# Patient Record
Sex: Male | Born: 1975 | Hispanic: No | Marital: Married | Smoking: Former smoker
Health system: Southern US, Community
[De-identification: ages and names within clinical notes are randomized; demographics above are authoritative.]

## PROBLEM LIST (undated history)

## (undated) DIAGNOSIS — R739 Hyperglycemia, unspecified: Secondary | ICD-10-CM

## (undated) DIAGNOSIS — L308 Other specified dermatitis: Secondary | ICD-10-CM

## (undated) DIAGNOSIS — E785 Hyperlipidemia, unspecified: Secondary | ICD-10-CM

## (undated) HISTORY — DX: Hyperlipidemia, unspecified: E78.5

## (undated) HISTORY — DX: Hyperglycemia, unspecified: R73.9

## (undated) HISTORY — DX: Other specified dermatitis: L30.8

---

## 2006-07-02 HISTORY — PX: HERNIA REPAIR: SHX51

## 2006-07-20 ENCOUNTER — Emergency Department (HOSPITAL_COMMUNITY): Admission: EM | Admit: 2006-07-20 | Discharge: 2006-07-20 | Payer: Self-pay | Admitting: Family Medicine

## 2006-07-25 ENCOUNTER — Emergency Department (HOSPITAL_COMMUNITY): Admission: EM | Admit: 2006-07-25 | Discharge: 2006-07-25 | Payer: Self-pay | Admitting: Emergency Medicine

## 2006-10-29 ENCOUNTER — Ambulatory Visit: Payer: Self-pay | Admitting: Internal Medicine

## 2006-12-24 ENCOUNTER — Ambulatory Visit (HOSPITAL_COMMUNITY): Admission: RE | Admit: 2006-12-24 | Discharge: 2006-12-24 | Payer: Self-pay | Admitting: *Deleted

## 2010-11-14 NOTE — Op Note (Signed)
[name], Andrew Harrison                     ACCOUNT NO.:  [id number]   MEDICAL RECORD NO.:  [id number]          PATIENT TYPE:  AMB   LOCATION:  DAY                          FACILITY:  [facility]   PHYSICIAN:  Alfonse Ras, MD   DATE OF BIRTH:  05-Jan-1976   DATE OF PROCEDURE:  12/24/2006  DATE OF DISCHARGE:                               OPERATIVE REPORT   PREOPERATIVE DIAGNOSIS:  Left inguinal hernia.   POSTOPERATIVE DIAGNOSIS:  Left inguinal hernia,  indirect inguinal  hernia.   PROCEDURE:  Left inguinal hernia repair, high ligation with mesh repair.   ANESTHESIA:  General laryngeal mask.   SURGEON:  Baruch Merl, M.D.   DESCRIPTION:  The patient was taken to the operating room, placed in a  supine position after adequate general anesthesia was induced using  laryngeal mask, the left groin was prepped and draped in normal sterile  fashion.  Using an oblique incision over the inguinal canal, I dissected  down onto the external oblique fascia.  This was opened along its fibers  and down to the external ring.  Ilioinguinal nerve was identified and  retracted medially.  Dissection was taken down on the external oblique  fascia down to the inguinal ligament.  The spermatic cord was surrounded  using the Penrose drain.  The cord was mobilized and skeletonized.  An  indirect hernia sac was identified and dissected off the cord  structures.  This was taken up to the internal ring and ligated with 0-0  Surgilon pursestring suture.  I then placed a piece of 1 x 4 Atrium mesh  over Hesselbach's triangle on the floor and tacked using running 2-0  Prolene to the pubic tubercle the inguinal ligament.  I split it and  brought outside the internal ring and tacked it to the transversalis  fascia in a similar fashion.  Because the patient did not have a direct  hernia defect.  I felt no further mesh needed to be placed.  The  external oblique fascia was closed using running 3-0 Vicryl suture.  After the  ilioinguinal nerve was placed back in its normal anatomic  position.  The skin was closed with staples.  All tissues were injected  using 0.5 Marcaine.  Skin was closed with staples.  The patient  tolerated the procedure well and went to PACU in good condition.      Alfonse Ras, MD  Electronically Signed     KRE/MEDQ  D:  12/24/2006  T:  12/25/2006  Job:  [id number]

## 2011-04-18 LAB — BASIC METABOLIC PANEL
GFR calc non Af Amer: >60
Potassium: 3.9
Sodium: 139

## 2011-04-18 LAB — HEMOGLOBIN AND HEMATOCRIT, BLOOD: Hemoglobin: 16.7

## 2013-03-03 ENCOUNTER — Other Ambulatory Visit (HOSPITAL_COMMUNITY)
Admission: RE | Admit: 2013-03-03 | Discharge: 2013-03-03 | Disposition: A | Discharge status: Home / Self Care | Payer: Self-pay | Source: Ambulatory Visit | Attending: Family Medicine | Admitting: Family Medicine

## 2013-03-03 ENCOUNTER — Encounter (HOSPITAL_COMMUNITY): Payer: Self-pay | Admitting: Emergency Medicine

## 2013-03-03 ENCOUNTER — Emergency Department (HOSPITAL_COMMUNITY)
Admission: EM | Admit: 2013-03-03 | Discharge: 2013-03-03 | Disposition: A | Discharge status: Home / Self Care | Payer: Self-pay | Source: Home / Self Care

## 2013-03-03 DIAGNOSIS — L98499 Non-pressure chronic ulcer of skin of other sites with unspecified severity: Secondary | ICD-10-CM

## 2013-03-03 DIAGNOSIS — L989 Disorder of the skin and subcutaneous tissue, unspecified: Secondary | ICD-10-CM | POA: Insufficient documentation

## 2013-03-03 LAB — COMPREHENSIVE METABOLIC PANEL
ALT: 101 U/L — ABNORMAL HIGH (ref 0–53)
Alkaline Phosphatase: 79 U/L (ref 39–117)
CO2: 27 mEq/L (ref 19–32)
GFR calc Af Amer: >90 mL/min (ref >90)
Glucose, Bld: 157 mg/dL — ABNORMAL HIGH (ref 70–99)
Potassium: 3.5 mEq/L (ref 3.5–5.1)
Sodium: 139 mEq/L (ref 135–145)
Total Protein: 7.6 g/dL (ref 6.0–8.3)

## 2013-03-03 LAB — CBC WITH DIFFERENTIAL/PLATELET
Lymphocytes Relative: 34 % (ref 12–46)
Lymphs Abs: 2.4 10*3/uL (ref 0.7–4.0)
Neutro Abs: 3.2 10*3/uL (ref 1.7–7.7)
Neutrophils Relative %: 46 % (ref 43–77)
Platelets: 179 10*3/uL (ref 150–400)
RBC: 4.96 MIL/uL (ref 4.22–5.81)
WBC: 6.9 10*3/uL (ref 4.0–10.5)

## 2013-03-03 MED ORDER — PREDNISONE 20 MG PO TABS: ORAL_TABLET | ORAL | Status: DC

## 2013-03-03 NOTE — ED Notes (Signed)
Via brother in law (interpreter [place]ese) Pt c/o multiple skin/ulcers all over body onset 1 week ... Both feet, both hands, abd, thighs States this has happened in the past but only last for 2 days and go away... No PCP and has not seen a doctor for this Sxs today include: drainage, pain Denies: fevers Alert w/no signs of acute distress.

## 2013-03-03 NOTE — ED Provider Notes (Signed)
Andrew Harrison is a 37 y.o. male who presents to Urgent Care today for skin ulceration. Patient has multiple patches of skin ulcerations across his feet trunk and hands. This is been present for about one week. They were initially painful blisters. They remained painful when they have become ulcerated. The patient has tried using over-the-counter antibiotic ointment which does not helped. He cannot work because of pain. He denies any fevers or chills body aches nausea vomiting diarrhea or new medications. He feels well otherwise. This has happened several times in the past and typically last one or 2 weeks. This is a worse example of the problem. The patient is from [place] but has been living in the [place] now for 7 years.    PMH reviewed. As above History  Substance Use Topics  . Smoking status: Current Every Day Smoker  . Smokeless tobacco: Not on file  . Alcohol Use: Yes   no insurance no medical doctor. ROS as above Medications reviewed. No current facility-administered medications for this encounter.   Current Outpatient Prescriptions  Medication Sig Dispense Refill  . predniSONE (DELTASONE) 20 MG tablet 3 pills po days 1-20 2 pills po days 20-30  1 pill po days 30-40 1/2 pill po days 40-50.  DISP QS  1 tablet  0    Exam:  BP 119/76  Pulse 79  Temp(Src) 97.9 F (36.6 C) (Oral)  Resp 20  SpO2 99% Gen: Well NAD HEENT: EOMI,  MMM Lungs: CTABL Nl WOB Heart: RRR no MRG Abd: NABS, NT, ND Exts: Non edematous BL  LE, warm and well perfused.  Skin: Multiple circular ulcerations on the trunk and extremities. Necrosis through the dermis into the subcutaneous fat. Tender to palpation at the edges.     Results for orders placed during the hospital encounter of 03/03/13 (from the past 24 hour(s))  CBC WITH DIFFERENTIAL     Status: Abnormal   Collection Time    03/03/13  1:49 PM      Result Value Range   WBC 6.9  4.0 - 10.5 K/uL   RBC 4.96  4.22 - 5.81 MIL/uL   Hemoglobin 15.2   13.0 - 17.0 g/dL   HCT 40.9  81.1 - 91.4 %   MCV 88.3  78.0 - 100.0 fL   MCH 30.6  26.0 - 34.0 pg   MCHC 34.7  30.0 - 36.0 g/dL   RDW 78.2  95.6 - 21.3 %   Platelets 179  150 - 400 K/uL   Neutrophils Relative % 46  43 - 77 %   Neutro Abs 3.2  1.7 - 7.7 K/uL   Lymphocytes Relative 34  12 - 46 %   Lymphs Abs 2.4  0.7 - 4.0 K/uL   Monocytes Relative 8  3 - 12 %   Monocytes Absolute 0.6  0.1 - 1.0 K/uL   Eosinophils Relative 11 (*) 0 - 5 %   Eosinophils Absolute 0.8 (*) 0.0 - 0.7 K/uL   Basophils Relative 1  0 - 1 %   Basophils Absolute 0.1  0.0 - 0.1 K/uL   No results found.  Skin biopsy: Consent obtained and timeout performed The medial border of the ulcer on the left chest wall was cleaned with alcohol and anesthetized using 1 mL of 2% lidocaine with epinephrine. The area was then cleaned with Betadine and sterilely draped.  Elliptical skin biopsy from the normal appearing skin into the ulcer was taken. Total length about one and half centimeter.  The wound was closed with one horizontal mattress suture and 2 simple interrupted sutures using 4-0 Prolene.    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   I'm doubtful for infectious etiology as this is been recurrent during multiple times in the patient's life in both [place] and in the [place].  New medications and no flulike illness.   CBC, BMP, ANA, rheumatoid factor, sedimentation rate are pending.  Skin biopsy is pending.  We'll treat with prednisone 60 mg with a slow taper Patient will followup in 6 days for suture removal and followup.   Refer patient to community access clinic Work note for one week     Rodolph Bong, MD 03/03/13 [phone]

## 2013-03-09 ENCOUNTER — Encounter (HOSPITAL_COMMUNITY): Payer: Self-pay

## 2013-03-09 ENCOUNTER — Emergency Department (INDEPENDENT_AMBULATORY_CARE_PROVIDER_SITE_OTHER)
Admission: EM | Admit: 2013-03-09 | Discharge: 2013-03-09 | Disposition: A | Discharge status: Home / Self Care | Payer: Self-pay | Source: Home / Self Care | Attending: Family Medicine | Admitting: Family Medicine

## 2013-03-09 DIAGNOSIS — L98499 Non-pressure chronic ulcer of skin of other sites with unspecified severity: Secondary | ICD-10-CM

## 2013-03-09 DIAGNOSIS — L98491 Non-pressure chronic ulcer of skin of other sites limited to breakdown of skin: Secondary | ICD-10-CM

## 2013-03-09 NOTE — ED Provider Notes (Addendum)
CSN: [id number]     Arrival date & time 03/09/13  0802 History   First MD Initiated Contact with Patient 03/09/13 [id number]     Chief Complaint  Patient presents with  . Skin Problem   (Consider location/radiation/quality/duration/timing/severity/associated sxs/prior Treatment) HPI Followup skin ulcerations.  Patient was seen approximately one week ago with multiple ulcerations on his feet extremities and trunk. These were painful. This had occurred several times in the past and had been self-limiting. These occurred both with the patient was living in [place] and in the [place]. He had no systemic signs or symptoms and felt well otherwise. 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  A skin biopsy was obtained from a lesion on the chest wall which was nondiagnostic and listed below. The patient was empirically started on a long prednisone course and had significant improvement. He notes the lesions are resolving and feels well. No fevers chills nausea vomiting or diarrhea.  History  Substance Use Topics  . Smoking status: Current Every Day Smoker  . Smokeless tobacco: Not on file  . Alcohol Use: Yes    Review of Systems  Allergies  Review of patient's allergies indicates no known allergies.  Home Medications   Current Outpatient Rx  Name  Route  Sig  Dispense  Refill  . predniSONE (DELTASONE) 20 MG tablet      3 pills po days 1-20 2 pills po days 20-30  1 pill po days 30-40 1/2 pill po days 40-50.  DISP QS   1 tablet   0    BP 133/84  Pulse 62  Temp(Src) 98.7 F (37.1 C) (Oral)  Resp 16  SpO2 98% Physical Exam Gen.:  Well-appearing NAD Resolving ulcerations with pink layer of new skin. Nontender no crusting or discharge.   ED Course  Procedures  CMP     Component Value Date/Time   NA 139 03/03/2013 1349   K 3.5 03/03/2013 1349   CL 101 03/03/2013 1349   CO2 27 03/03/2013 1349   GLUCOSE 157*  03/03/2013 1349   BUN 17 03/03/2013 1349   CREATININE 0.69 03/03/2013 1349   CALCIUM 9.5 03/03/2013 1349   PROT 7.6 03/03/2013 1349   ALBUMIN 3.8 03/03/2013 1349   AST 56* 03/03/2013 1349   ALT 101* 03/03/2013 1349   ALKPHOS 79 03/03/2013 1349   BILITOT 0.4 03/03/2013 1349   GFRNONAA >90 03/03/2013 1349   GFRAA >90 03/03/2013 1349    CBC    Component Value Date/Time   WBC 6.9 03/03/2013 1349   RBC 4.96 03/03/2013 1349   HGB 15.2 03/03/2013 1349   HCT 43.8 03/03/2013 1349   PLT 179 03/03/2013 1349   MCV 88.3 03/03/2013 1349   MCH 30.6 03/03/2013 1349   MCHC 34.7 03/03/2013 1349   RDW 11.7 03/03/2013 1349   LYMPHSABS 2.4 03/03/2013 1349   MONOABS 0.6 03/03/2013 1349   EOSABS 0.8* 03/03/2013 1349   BASOSABS 0.1 03/03/2013 1349     SKIN PATHOLOGY RESULTS:   Diagnosis Skin , left medial chest EPIDERMAL NECROSIS AND ULCERATION WITH SPONGIOTIC FEATURES AND OVERLYING THICK PURULENT CRUST, SEE DESCRIPTION Microscopic Comment The epidermis exhibit areas of partial necrosis and foci of ulceration with an overlying thick crust containing numerous neutrophils and bacterial colonies. The relatively intact areas of epidermis exhibit slight spongiosis with underlying papillary dermal edema. In the dermis is a relatively sparse mixed inflammatory infiltrate comprised of lymphocytes, some histiocytes  and a few eosinophils. A PAS stain has been performed, and is negative for fungi. Numerous Gram-positive cocci are seen with a Gram stain only superficially within the crust. Overall, the findings are not completely diagnostic. The differential diagnosis includes impetigo; however, with spongiotic features and a mixed inflammatory infiltrate present, this may represent an eczematous process such as allergic contact dermatitis or nummular dermatitis with secondary changes due to excoriation and superimposed impetiginization. There is no diffuse or deep neutrophilic dermal inflammation to suggest pyoderma gangrenosum in this  biopsy. Allene Dillon MD Dermatopathologist, Electronic Signature (Case signed 03/08/2013) Specimen Gross and Clinical Information Specimen(s) Obtained: Skin , left medial chest Specimen Clinical Information Hx 1wk ulcerations on trunk & feet. Painful, ? pyoderma gangrenosum (tl) Gross Received in formalin is a 1.1 x 0.6 x 0.3 cm portion of tan skin and subcutaneous tissue. The skin surface is irregularly thickened. The specimen is sectioned and entirely submitted in one cassette. (GP:gt, 93/14) 1 of 1   Procedure note: 3 simple interrupted sutures were removed from the skin biopsy site.  MDM   1. Skin ulceration, limited to breakdown of skin    Diagnosis is unclear. He appears to be resolving. I am doubtful that the patient is such a long course of prednisone. We'll discontinue the prednisone course after 5 more days. We have provided him information on how to followup with the community wellness clinic.  He will followup back at the [facility] urgent care as needed.  Discussed warning signs or symptoms. Please see discharge instructions. Patient expresses understanding.     Rodolph Bong, MD 03/09/13 1308  Rodolph Bong, MD 03/09/13 [phone]

## 2013-03-09 NOTE — ED Notes (Signed)
Recheck of skin problem on 9-2, family member Nurse, learning disability; reportedly improved

## 2013-07-21 ENCOUNTER — Emergency Department (HOSPITAL_COMMUNITY)
Admission: EM | Admit: 2013-07-21 | Discharge: 2013-07-21 | Disposition: A | Discharge status: Home / Self Care | Payer: 59 | Source: Home / Self Care

## 2013-07-21 ENCOUNTER — Encounter (HOSPITAL_COMMUNITY): Payer: Self-pay | Admitting: Emergency Medicine

## 2013-07-21 DIAGNOSIS — R21 Rash and other nonspecific skin eruption: Secondary | ICD-10-CM

## 2013-07-21 MED ORDER — CEPHALEXIN 500 MG PO CAPS: 500.0000 mg | ORAL_CAPSULE | Freq: Four times a day (QID) | ORAL | Status: DC

## 2013-07-21 MED ORDER — PREDNISONE 10 MG PO TABS: ORAL_TABLET | ORAL | Status: DC

## 2013-07-21 NOTE — ED Provider Notes (Cosign Needed)
CSN: [id number]     Arrival date & time 07/21/13  1512 History   First MD Initiated Contact with Patient 07/21/13 1729     Chief Complaint  Patient presents with  . Urticaria   (Consider location/radiation/quality/duration/timing/severity/associated sxs/prior Treatment) HPI Comments: Rash that began 3 days ago on both feet, both hands and left lateral chest wall. States that episode is identical to that which he experienced in Sept. 2014. Denies feeling unwell or fevers. No joint pain or swelling. No known contacts with similar rash or new plant/animal exposures. No new medications. No recent travel. 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  States rash is slightly tender, but mostly pruritis and slightly warm to touch.   Patient is a 38 y.o. male presenting with rash. The history is provided by the patient and a relative. The history is limited by a language barrier. A language interpreter was used (+son).  Rash Associated symptoms: no headaches     History reviewed. No pertinent past medical history. History reviewed. No pertinent past surgical history. No family history on file. History  Substance Use Topics  . Smoking status: Current Every Day Smoker  . Smokeless tobacco: Not on file  . Alcohol Use: Yes    Review of Systems  Constitutional: Negative.   HENT: Negative.   Eyes: Negative.   Respiratory: Negative.   Cardiovascular: Negative.   Gastrointestinal: Negative.   Endocrine: Negative for polydipsia, polyphagia and polyuria.  Genitourinary: Negative.   Musculoskeletal: Negative.   Skin: Positive for rash.  Allergic/Immunologic: Negative.   Neurological: Negative for dizziness, weakness, light-headedness and headaches.  Hematological: Negative for adenopathy. Does not bruise/bleed easily.  Psychiatric/Behavioral: Negative for confusion.    Allergies  Review of patient's allergies indicates no known  allergies.  Home Medications   Current Outpatient Rx  Name  Route  Sig  Dispense  Refill  . cephALEXin (KEFLEX) 500 MG capsule   Oral   Take 1 capsule (500 mg total) by mouth 4 (four) times daily.   28 capsule   0   . predniSONE (DELTASONE) 10 MG tablet      6 tabs po QD day 1, 5 tabs po QD day 2, 4 tabs po QD day 3, 3 tabs po QD days 4 & 5, 2 tabs po QD days 6 & 7, 1 tab po QD day 8, 9, & 10 then stop   28 tablet   0   . predniSONE (DELTASONE) 20 MG tablet      3 pills po days 1-20 2 pills po days 20-30  1 pill po days 30-40 1/2 pill po days 40-50.  DISP QS   1 tablet   0    BP 114/75  Pulse 93  Temp(Src) 98.6 F (37 C) (Oral)  SpO2 99% Physical Exam  Nursing note and vitals reviewed. Constitutional: He is oriented to person, place, and time. He appears well-developed and well-nourished. No distress.  HENT:  Head: Normocephalic and atraumatic.  Right Ear: External ear normal.  Left Ear: External ear normal.  Nose: Nose normal.  Mouth/Throat: Oropharynx is clear and moist.  Eyes: Conjunctivae are normal. Right eye exhibits no discharge. Left eye exhibits no discharge. No scleral icterus.  Neck: Normal range of motion. Neck supple. No thyromegaly present.  Cardiovascular: Normal rate, regular rhythm and normal heart sounds.   Pulmonary/Chest: Effort normal and breath sounds normal. No respiratory distress.  Abdominal: Soft. Bowel sounds are normal. He exhibits  no distension. There is no tenderness.  Musculoskeletal: Normal range of motion.  Lymphadenopathy:    He has no cervical adenopathy.  Neurological: He is alert and oriented to person, place, and time.  Skin: Skin is warm and dry. Rash noted.  Large 3-5 cm in diameter macular annular hyperpigmented purple lesions with erythematous borders that are slightly warm to touch. One lesion at base of right great toes has three small 2-3 mm vesicles in central portion of lesion. Lesions located at bilateral hands and  feet and single lesion located at left lateral chest wall.   Psychiatric: He has a normal mood and affect. His behavior is normal.    ED Course  Procedures (including critical care time) Labs Review Labs Reviewed - No data to display Imaging Review No results found.  EKG Interpretation    Date/Time:    Ventricular Rate:    PR Interval:    QRS Duration:   QT Interval:    QTC Calculation:   R Axis:     Text Interpretation:              MDM  Case discussed with and patient examined by Dr. Clinton Sawyer. Rash consistent with episode of same in Sept. 2014. Responded to prednisone taper, therefore, will prescribe the same along with 7 day course of cephalexin as some lesions have become excoriated and slightly erythematous as a result of scratching and may be in early stages of secondary infection. Strongly encouraged patient and family to take pictures of current rash once they arrive home and contact any of the dermatology providers listed on today's paperwork for follow up evaluation. Family encouraged to bring photographs of rash to dermatology follow up.     Andrew Harrison, [place] 07/21/13 2022

## 2013-07-21 NOTE — ED Notes (Signed)
Patient states broke out with hives three days ago States ate nothing new Has not been using any new detergents

## 2013-07-21 NOTE — Discharge Instructions (Signed)
It is very important that you contact one of the dermatologists listed on your discharge paperwork for follow up. You have three offices listed. Please call all three to see which office can see you as soon as possible. Please, if possible, when you go home, take pictures of the rash so you can bring the pictures with you to your appointment with the skin specialist.

## 2014-08-20 ENCOUNTER — Emergency Department (HOSPITAL_COMMUNITY): Payer: 59

## 2014-08-20 ENCOUNTER — Encounter (HOSPITAL_COMMUNITY): Payer: Self-pay | Admitting: *Deleted

## 2014-08-20 ENCOUNTER — Emergency Department (HOSPITAL_COMMUNITY)
Admission: EM | Admit: 2014-08-20 | Discharge: 2014-08-20 | Disposition: A | Discharge status: Home / Self Care | Payer: 59 | Attending: Emergency Medicine | Admitting: Emergency Medicine

## 2014-08-20 DIAGNOSIS — R51 Headache: Secondary | ICD-10-CM | POA: Insufficient documentation

## 2014-08-20 DIAGNOSIS — M542 Cervicalgia: Secondary | ICD-10-CM | POA: Diagnosis not present

## 2014-08-20 DIAGNOSIS — Z720 Tobacco use: Secondary | ICD-10-CM | POA: Diagnosis not present

## 2014-08-20 DIAGNOSIS — R42 Dizziness and giddiness: Secondary | ICD-10-CM | POA: Insufficient documentation

## 2014-08-20 DIAGNOSIS — H538 Other visual disturbances: Secondary | ICD-10-CM | POA: Diagnosis not present

## 2014-08-20 LAB — BASIC METABOLIC PANEL
Anion gap: 5 (ref 5–15)
BUN: 9 mg/dL (ref 6–23)
CO2: 25 mmol/L (ref 19–32)
CREATININE: 0.77 mg/dL (ref 0.50–1.35)
Calcium: 8.6 mg/dL (ref 8.4–10.5)
Chloride: 106 mmol/L (ref 96–112)
GLUCOSE: 100 mg/dL — AB (ref 70–99)
POTASSIUM: 3.8 mmol/L (ref 3.5–5.1)
Sodium: 136 mmol/L (ref 135–145)

## 2014-08-20 LAB — CBC WITH DIFFERENTIAL/PLATELET
BASOS ABS: 0.1 10*3/uL (ref 0.0–0.1)
BASOS PCT: 1 % (ref 0–1)
EOS ABS: 0.6 10*3/uL (ref 0.0–0.7)
EOS PCT: 9 % — AB (ref 0–5)
HEMATOCRIT: 42.9 % (ref 39.0–52.0)
HEMOGLOBIN: 14.8 g/dL (ref 13.0–17.0)
Lymphocytes Relative: 34 % (ref 12–46)
Lymphs Abs: 2.1 10*3/uL (ref 0.7–4.0)
MCH: 30 pg (ref 26.0–34.0)
MCHC: 34.5 g/dL (ref 30.0–36.0)
MCV: 87 fL (ref 78.0–100.0)
MONO ABS: 0.3 10*3/uL (ref 0.1–1.0)
MONOS PCT: 5 % (ref 3–12)
Neutro Abs: 3.2 10*3/uL (ref 1.7–7.7)
Neutrophils Relative %: 51 % (ref 43–77)
Platelets: 174 10*3/uL (ref 150–400)
RBC: 4.93 MIL/uL (ref 4.22–5.81)
RDW: 12.1 % (ref 11.5–15.5)
WBC: 6.3 10*3/uL (ref 4.0–10.5)

## 2014-08-20 MED ORDER — MECLIZINE HCL 25 MG PO TABS: 25.0000 mg | ORAL_TABLET | Freq: Three times a day (TID) | ORAL | Status: DC | PRN

## 2014-08-20 MED ORDER — SODIUM CHLORIDE 0.9 % IV BOLUS (SEPSIS)
1000.0000 mL | Freq: Once | INTRAVENOUS | Status: AC
  Administered 2014-08-20: 1000 mL via INTRAVENOUS

## 2014-08-20 MED ORDER — MECLIZINE HCL 25 MG PO TABS
25.0000 mg | ORAL_TABLET | Freq: Once | ORAL | Status: AC
  Administered 2014-08-20: 25 mg via ORAL
  Filled 2014-08-20: qty 1

## 2014-08-20 NOTE — ED Notes (Signed)
Patient transported to MRI, son going with patient to translate

## 2014-08-20 NOTE — ED Notes (Signed)
Patient remains in MRI 

## 2014-08-20 NOTE — ED Notes (Signed)
Patient denies any pain.  He is alert and oriented.  Patient drinking at this time.  Awaiting further orders

## 2014-08-20 NOTE — Discharge Instructions (Signed)

## 2014-08-20 NOTE — ED Provider Notes (Signed)
CSN: [id number]     Arrival date & time 08/20/14  0806 History   First MD Initiated Contact with Patient 08/20/14 [phone]     Chief Complaint  Patient presents with  . Headache  . Dizziness     (Consider location/radiation/quality/duration/timing/severity/associated sxs/prior Treatment) HPI Comments: Patient presents with headache and dizziness. He does not speak [place] and history is obtained through her family member who interprets. He reports a three-day history of headache and dizziness. He states that 3 days ago he started having a frontal headache which was fairly mild but became more intense over the 3 days. He states at the beginning when the headache started he had some pain in the right side of his neck but he currently doesn't have any neck pain. He has dizziness which started with the onset of the headache which he describes as a spinning sensation. He denies any nausea or vomiting. He denies any speech deficits. He does have some blurry vision when the dizziness gets bad. He denies any numbness or weakness in his extremities. He denies any significant ataxia. He states he's had a history of dizziness once in the past when he was a child but denies any similar headaches. He denies any recent head trauma. He denies any URI symptoms.  Patient is a 39 y.o. male presenting with headaches and dizziness.  Headache Associated symptoms: dizziness and neck pain   Associated symptoms: no abdominal pain, no back pain, no congestion, no cough, no diarrhea, no fatigue, no fever, no nausea, no numbness, no vomiting and no weakness   Dizziness Associated symptoms: headaches   Associated symptoms: no blood in stool, no chest pain, no diarrhea, no nausea, no shortness of breath, no vomiting and no weakness     History reviewed. No pertinent past medical history. History reviewed. No pertinent past surgical history. History reviewed. No pertinent family history. History  Substance Use Topics  .  Smoking status: Current Every Day Smoker  . Smokeless tobacco: Not on file  . Alcohol Use: Yes    Review of Systems  Constitutional: Negative for fever, chills, diaphoresis and fatigue.  HENT: Negative for congestion, rhinorrhea and sneezing.   Eyes: Positive for visual disturbance.  Respiratory: Negative for cough, chest tightness and shortness of breath.   Cardiovascular: Negative for chest pain and leg swelling.  Gastrointestinal: Negative for nausea, vomiting, abdominal pain, diarrhea and blood in stool.  Genitourinary: Negative for frequency, hematuria, flank pain and difficulty urinating.  Musculoskeletal: Positive for neck pain. Negative for back pain and arthralgias.  Skin: Negative for rash.  Neurological: Positive for dizziness and headaches. Negative for speech difficulty, weakness and numbness.      Allergies  Review of patient's allergies indicates no known allergies.  Home Medications   Prior to Admission medications   Medication Sig Start Date End Date Taking? Authorizing Provider  cephALEXin (KEFLEX) 500 MG capsule Take 1 capsule (500 mg total) by mouth 4 (four) times daily. Patient not taking: Reported on 08/20/2014 07/21/13   Ria Clock, PA  meclizine (ANTIVERT) 25 MG tablet Take 1 tablet (25 mg total) by mouth 3 (three) times daily as needed for dizziness. 08/20/14   Rolan Bucco, MD  predniSONE (DELTASONE) 10 MG tablet 6 tabs po QD day 1, 5 tabs po QD day 2, 4 tabs po QD day 3, 3 tabs po QD days 4 & 5, 2 tabs po QD days 6 & 7, 1 tab po QD day 8, 9, & 10 then stop  Patient not taking: Reported on 08/20/2014 07/21/13   Ria Clock, PA  predniSONE (DELTASONE) 20 MG tablet 3 pills po days 1-20 2 pills po days 20-30  1 pill po days 30-40 1/2 pill po days 40-50.  DISP QS Patient not taking: Reported on 08/20/2014 03/03/13   Rodolph Bong, MD   BP 122/75 mmHg  Pulse 67  Temp(Src) 98.2 F (36.8 C) (Oral)  Resp 15  SpO2 99% Physical Exam   Constitutional: He is oriented to person, place, and time. He appears well-developed and well-nourished.  HENT:  Head: Normocephalic and atraumatic.  Eyes: Pupils are equal, round, and reactive to light.  Positive horizontal nystagmus with a fast component to the right. No vertical or rotational nystagmus  Neck: Normal range of motion. Neck supple.  No meningismus  Cardiovascular: Normal rate, regular rhythm and normal heart sounds.   Pulmonary/Chest: Effort normal and breath sounds normal. No respiratory distress. He has no wheezes. He has no rales. He exhibits no tenderness.  Abdominal: Soft. Bowel sounds are normal. There is no tenderness. There is no rebound and no guarding.  Musculoskeletal: Normal range of motion. He exhibits no edema.  Lymphadenopathy:    He has no cervical adenopathy.  Neurological: He is alert and oriented to person, place, and time.  Motor 5 out of 5 all extremities. Sensation grossly intact to light touch all extremities. No pronator drift. Finger-nose intact. Cranial nerves II through XII grossly intact.  Skin: Skin is warm and dry. No rash noted.  Psychiatric: He has a normal mood and affect.    ED Course  Procedures (including critical care time) Labs Review Labs Reviewed  CBC WITH DIFFERENTIAL/PLATELET - Abnormal; Notable for the following:    Eosinophils Relative 9 (*)    All other components within normal limits  BASIC METABOLIC PANEL - Abnormal; Notable for the following:    Glucose, Bld 100 (*)    All other components within normal limits    Imaging Review Ct Head Wo Contrast  08/20/2014   CLINICAL DATA:  Headache and dizziness for 3 days. Initial encounter.  EXAM: CT HEAD WITHOUT CONTRAST  TECHNIQUE: Contiguous axial images were obtained from the base of the skull through the vertex without intravenous contrast.  COMPARISON:  None.  FINDINGS: No evidence for acute infarction, hemorrhage, mass lesion, hydrocephalus, or extra-axial fluid. Normal  for age cerebral volume. No white matter disease. Mega cisterna magna. Calvarium intact. No visible acute sinus or mastoid disease.  IMPRESSION: Negative.   Electronically Signed   By: Davonna Belling M.D.   On: 08/20/2014 09:34   Mr Maxine Glenn Head Wo Contrast  08/20/2014   CLINICAL DATA:  Dizziness and frontal headaches for 3 days.  EXAM: MRI HEAD WITHOUT CONTRAST  MRA HEAD WITHOUT CONTRAST  TECHNIQUE: Multiplanar, multiecho pulse sequences of the brain and surrounding structures were obtained without intravenous contrast. Angiographic images of the head were obtained using MRA technique without contrast.  COMPARISON:  None.  FINDINGS: MRI HEAD FINDINGS  No acute infarct, hemorrhage, or mass lesion is present. The ventricles are of normal size. No significant extraaxial fluid collection is present.  Flow is present in the major intracranial arteries. The globes and orbits are intact. Mild mucosal thickening is present in the anterior ethmoid air cells. The air are polypoid lesions at the floor of the left maxillary sinus. Mild mucosal thickening is noted in the floor of the maxillary sinuses bilaterally.  MRA HEAD FINDINGS  The internal carotid arteries  are within normal limits from the high cervical segments through the ICA termini bilaterally. An infundibulum is noted at the origin of the right posterior communicating artery. The A1 and M1 segments are normal. No definite anterior communicating artery is present. The MCA bifurcations are within normal limits. MCA and ACA branch vessels are normal.  The right vertebral artery is the dominant vessel. The basilar artery is normal. Both AICA vessels are dominant. The basilar artery is normal. The posterior cerebral arteries originate from the basilar tip. The PCA branch vessels are within normal limits.  IMPRESSION: 1. Normal MRI of the brain. 2. Normal variant MRA circle of Willis without evidence for significant proximal stenosis, aneurysm, or branch vessel occlusion.    Electronically Signed   By: Marin Roberts M.D.   On: 08/20/2014 11:39   Mr Brain Wo Contrast  08/20/2014   CLINICAL DATA:  Dizziness and frontal headaches for 3 days.  EXAM: MRI HEAD WITHOUT CONTRAST  MRA HEAD WITHOUT CONTRAST  TECHNIQUE: Multiplanar, multiecho pulse sequences of the brain and surrounding structures were obtained without intravenous contrast. Angiographic images of the head were obtained using MRA technique without contrast.  COMPARISON:  None.  FINDINGS: MRI HEAD FINDINGS  No acute infarct, hemorrhage, or mass lesion is present. The ventricles are of normal size. No significant extraaxial fluid collection is present.  Flow is present in the major intracranial arteries. The globes and orbits are intact. Mild mucosal thickening is present in the anterior ethmoid air cells. The air are polypoid lesions at the floor of the left maxillary sinus. Mild mucosal thickening is noted in the floor of the maxillary sinuses bilaterally.  MRA HEAD FINDINGS  The internal carotid arteries are within normal limits from the high cervical segments through the ICA termini bilaterally. An infundibulum is noted at the origin of the right posterior communicating artery. The A1 and M1 segments are normal. No definite anterior communicating artery is present. The MCA bifurcations are within normal limits. MCA and ACA branch vessels are normal.  The right vertebral artery is the dominant vessel. The basilar artery is normal. Both AICA vessels are dominant. The basilar artery is normal. The posterior cerebral arteries originate from the basilar tip. The PCA branch vessels are within normal limits.  IMPRESSION: 1. Normal MRI of the brain. 2. Normal variant MRA circle of Willis without evidence for significant proximal stenosis, aneurysm, or branch vessel occlusion.   Electronically Signed   By: Marin Roberts M.D.   On: 08/20/2014 11:39     EKG Interpretation   Date/Time:  Friday August 20 2014 08:37:13  EST Ventricular Rate:  75 PR Interval:  165 QRS Duration: 102 QT Interval:  370 QTC Calculation: 413 R Axis:   90 Text Interpretation:  Sinus rhythm Borderline right axis deviation ST  elev, probable normal early repol pattern No old tracing to compare  Confirmed by Artrice Kraker  MD, Nandini Bogdanski ([id number]) on 08/20/2014 8:51:25 AM      MDM   Final diagnoses:  Vertigo    Patient presents with headache and dizziness. His symptoms seem consistent with vertigo although he did have a headache with associated neck pain at one point. Given this I did do an MRI with an MRA to make sure his vertebral arteries were normal. These were both unremarkable. Patient's feeling much better after meclizine. His neurologic exam is normal. He's ambulating without ataxia. He was discharged home in good condition and given a prescription for meclizine. He was given a referral to  follow-up with [place] neurolog[facility] should his symptoms continue.    Rolan BuccoMelanie Pankaj Haack, MD 08/20/14 1336

## 2014-08-20 NOTE — ED Notes (Signed)
Patient remains alert.  He is able to track fingers.  No visual field loss on exam.  He is moving all extremities.  Patient speech is at baseline per the family.  Patient denies any loss of sensation on exam.

## 2014-08-20 NOTE — ED Notes (Signed)
Patient reports frontal headache for 3 days with dizziness.  No weakness.  No changes in gait nor ability to swallow.  Patient with no reported falls

## 2014-08-20 NOTE — ED Notes (Signed)
Pt reports headache and dizziness x 3 days. Denies n/v.

## 2014-08-20 NOTE — ED Notes (Signed)
[facility]  He denies any pain.  Denies dizziness.  Patient awaiting any further results

## 2014-08-20 NOTE — ED Notes (Signed)
Ambulated patient in hall. Patient seemed to walk steady on his feet.

## 2014-10-18 ENCOUNTER — Emergency Department (HOSPITAL_COMMUNITY): Payer: 59

## 2014-10-18 ENCOUNTER — Encounter (HOSPITAL_COMMUNITY): Payer: Self-pay | Admitting: Emergency Medicine

## 2014-10-18 ENCOUNTER — Emergency Department (HOSPITAL_COMMUNITY)
Admission: EM | Admit: 2014-10-18 | Discharge: 2014-10-18 | Disposition: A | Discharge status: Home / Self Care | Payer: 59 | Attending: Emergency Medicine | Admitting: Emergency Medicine

## 2014-10-18 DIAGNOSIS — M791 Myalgia: Secondary | ICD-10-CM | POA: Diagnosis not present

## 2014-10-18 DIAGNOSIS — Z87891 Personal history of nicotine dependence: Secondary | ICD-10-CM | POA: Diagnosis not present

## 2014-10-18 DIAGNOSIS — J069 Acute upper respiratory infection, unspecified: Secondary | ICD-10-CM | POA: Insufficient documentation

## 2014-10-18 DIAGNOSIS — R05 Cough: Secondary | ICD-10-CM | POA: Diagnosis present

## 2014-10-18 DIAGNOSIS — R059 Cough, unspecified: Secondary | ICD-10-CM

## 2014-10-18 MED ORDER — AZITHROMYCIN 250 MG PO TABS: 250.0000 mg | ORAL_TABLET | Freq: Every day | ORAL | Status: DC

## 2014-10-18 MED ORDER — AZITHROMYCIN 250 MG PO TABS
500.0000 mg | ORAL_TABLET | Freq: Once | ORAL | Status: AC
  Administered 2014-10-18: 500 mg via ORAL
  Filled 2014-10-18: qty 2

## 2014-10-18 MED ORDER — ALBUTEROL SULFATE HFA 108 (90 BASE) MCG/ACT IN AERS
2.0000 | INHALATION_SPRAY | RESPIRATORY_TRACT | Status: DC | PRN
  Administered 2014-10-18: 2 via RESPIRATORY_TRACT
  Filled 2014-10-18: qty 6.7

## 2014-10-18 MED ORDER — IBUPROFEN 600 MG PO TABS: 600.0000 mg | ORAL_TABLET | Freq: Four times a day (QID) | ORAL | Status: DC | PRN

## 2014-10-18 NOTE — Discharge Instructions (Signed)
Upper Respiratory Infection, Adult An upper respiratory infection (URI) is also known as the common cold. It is often caused by a type of germ (virus). Colds are easily spread (contagious). You can pass it to others by kissing, coughing, sneezing, or drinking out of the same glass. Usually, you get better in 1 or 2 weeks.  HOME CARE   Only take medicine as told by your doctor.  Use a warm mist humidifier or breathe in steam from a hot shower.  Drink enough water and fluids to keep your pee (urine) clear or pale yellow.  Get plenty of rest.  Return to work when your temperature is back to normal or as told by your doctor. You may use a face mask and wash your hands to stop your cold from spreading. GET HELP RIGHT AWAY IF:   After the first few days, you feel you are getting worse.  You have questions about your medicine.  You have chills, shortness of breath, or brown or red spit (mucus).  You have yellow or brown snot (nasal discharge) or pain in the face, especially when you bend forward.  You have a fever, puffy (swollen) neck, pain when you swallow, or white spots in the back of your throat.  You have a bad headache, ear pain, sinus pain, or chest pain.  You have a high-pitched whistling sound when you breathe in and out (wheezing).  You have a lasting cough or cough up blood.  You have sore muscles or a stiff neck. MAKE SURE YOU:   Understand these instructions.  Will watch your condition.  Will get help right away if you are not doing well or get worse. Document Released: 12/05/2007 Document Revised: 09/10/2011 Document Reviewed: 09/23/2013 ExitCare Patient Information 2015 [employer]. This information is not intended to replace advice given to you by your health care provider. Make sure you discuss any questions you have with your health care provider.  

## 2014-10-18 NOTE — ED Notes (Signed)
Declined W/C at D/C and was escorted to lobby by RN. 

## 2014-10-18 NOTE — ED Notes (Signed)
Pt c/o fever and body aches with cough x 3 days

## 2014-10-18 NOTE — ED Provider Notes (Signed)
CSN: [id number]     Arrival date & time 10/18/14  1102 History  This chart was scribed for non-physician practitioner, Marlon Pel, PA-C, working with Lorre Nick, MD, by Ronney Lion, ED Scribe. This patient was seen in room TR10C/TR10C and the patient's care was started at 1:42 PM.    Chief Complaint  Patient presents with  . Generalized Body Aches  . Cough   The history is provided by the patient. A language interpreter was used (pt speaks [place]).     HPI Comments: Andrew Harrison is a 39 y.o. male who presents to the Emergency Department complaining of constant fever, cough, and generalized myalgias that began 3 days ago. Patient reports he has had sick contact, as his whole family has been sick. He denies nausea, vomiting, or abdominal pain. Patient denies recent travel in the last 6 months. He has NKDA.   History reviewed. No pertinent past medical history. History reviewed. No pertinent past surgical history. History reviewed. No pertinent family history. History  Substance Use Topics  . Smoking status: Former Games developer  . Smokeless tobacco: Not on file  . Alcohol Use: Yes    Review of Systems  A complete 10 system review of systems was obtained and all systems are negative except as noted in the HPI and PMH.    Allergies  Review of patient's allergies indicates no known allergies.  Home Medications   Prior to Admission medications   Medication Sig Start Date End Date Taking? Authorizing Provider  azithromycin (ZITHROMAX) 250 MG tablet Take 1 tablet (250 mg total) by mouth daily. 1 tab everyday until complete 10/19/14   Marlon Pel, PA-C  cephALEXin (KEFLEX) 500 MG capsule Take 1 capsule (500 mg total) by mouth 4 (four) times daily. Patient not taking: Reported on 08/20/2014 07/21/13   Mathis Fare Presson, PA  ibuprofen (ADVIL,MOTRIN) 600 MG tablet Take 1 tablet (600 mg total) by mouth every 6 (six) hours as needed. 10/18/14   Deldrick Linch Neva Seat, PA-C  meclizine (ANTIVERT) 25 MG  tablet Take 1 tablet (25 mg total) by mouth 3 (three) times daily as needed for dizziness. 08/20/14   Rolan Bucco, MD  predniSONE (DELTASONE) 10 MG tablet 6 tabs po QD day 1, 5 tabs po QD day 2, 4 tabs po QD day 3, 3 tabs po QD days 4 & 5, 2 tabs po QD days 6 & 7, 1 tab po QD day 8, 9, & 10 then stop Patient not taking: Reported on 08/20/2014 07/21/13   Ria Clock, PA  predniSONE (DELTASONE) 20 MG tablet 3 pills po days 1-20 2 pills po days 20-30  1 pill po days 30-40 1/2 pill po days 40-50.  DISP QS Patient not taking: Reported on 08/20/2014 03/03/13   Rodolph Bong, MD   BP 118/69 mmHg  Pulse 76  Temp(Src) 98.5 F (36.9 C) (Oral)  Resp 18  SpO2 100% Physical Exam  Constitutional: He is oriented to person, place, and time. He appears well-developed and well-nourished. No distress.  HENT:  Head: Normocephalic and atraumatic.  Right Ear: Tympanic membrane is erythematous.  Eyes: Conjunctivae and EOM are normal.  Neck: Neck supple. No tracheal deviation present.  Cardiovascular: Normal rate.   Pulmonary/Chest: Effort normal. No respiratory distress. He has wheezes.  Musculoskeletal: Normal range of motion.  Neurological: He is alert and oriented to person, place, and time.  Skin: Skin is warm and dry.  Psychiatric: He has a normal mood and affect. His behavior is normal.  Nursing note and vitals reviewed.   ED Course  Procedures (including critical care time)  DIAGNOSTIC STUDIES: Oxygen Saturation is 100% on room air, normal by my interpretation.    COORDINATION OF CARE: 1:46 PM - Discussed treatment plan with pt at bedside which includes antibiotic medication, cough medication, and albuterol inhaler, and pt agreed to plan. Also advised patient to increase fluid intake. Patient verbalized understanding and agreed to plan.   Imaging Review Dg Chest 2 View  10/18/2014   CLINICAL DATA:  Fever, body aches with cough for 3 days  EXAM: CHEST  2 VIEW  COMPARISON:  12/19/2006   FINDINGS: The heart size and mediastinal contours are within normal limits. Both lungs are clear. The visualized skeletal structures are unremarkable.  IMPRESSION: No active cardiopulmonary disease.   Electronically Signed   By: Elige KoHetal  Patel   On: 10/18/2014 13:34    MDM   Final diagnoses:  URI (upper respiratory infection)    Medications  azithromycin (ZITHROMAX) tablet 500 mg (500 mg Oral Given 10/18/14 1407)    Filed Vitals:   10/18/14 1124 10/18/14 1319  BP: 118/69   Pulse: 79 76  Temp: 99.1 F (37.3 C) 98.5 F (36.9 C)  TempSrc: Oral Oral  Resp: 18 18  SpO2: 97% 100%      ED Discharge Orders  Hide   Start     Ordered   10/19/14 0000  azithromycin (ZITHROMAX) 250 MG tablet Daily DiscontinueReprint   10/18/14 1349   10/18/14 0000  ibuprofen (ADVIL,MOTRIN) 600 MG tablet Every 6 hours PRN DiscontinueReprint   10/18/14 1349     Pt over-all well appearing with normal vitals and chest xray. He has had a normal chest xray. Due to wheezing, language barrier, possibility that he has traveled recently will give abx, cough medications and albuterol inhaler. Strict return to ED precautions given.  39 y.o.Andrew Harrison's evaluation in the Emergency Department is complete. It has been determined that no acute conditions requiring further emergency intervention are present at this time. The patient/guardian have been advised of the diagnosis and plan. We have discussed signs and symptoms that warrant return to the ED, such as changes or worsening in symptoms.  Vital signs are stable at discharge. Filed Vitals:   10/18/14 1319  BP:   Pulse: 76  Temp: 98.5 F (36.9 C)  Resp: 18    Patient/guardian has voiced understanding and agreed to follow-up with the PCP or specialist.  I personally performed the services described in this documentation, which was scribed in my presence. The recorded information has been reviewed and is accurate.    Marlon Peliffany Markees Carns, PA-C 10/19/14  1502  Gerhard Munchobert Lockwood, MD 10/19/14 [phone]

## 2015-01-28 ENCOUNTER — Encounter (HOSPITAL_COMMUNITY): Payer: Self-pay | Admitting: Emergency Medicine

## 2015-01-28 ENCOUNTER — Emergency Department (HOSPITAL_COMMUNITY)
Admission: EM | Admit: 2015-01-28 | Discharge: 2015-01-28 | Disposition: A | Discharge status: Home / Self Care | Payer: 59 | Source: Home / Self Care | Attending: Family Medicine | Admitting: Family Medicine

## 2015-01-28 DIAGNOSIS — L509 Urticaria, unspecified: Secondary | ICD-10-CM | POA: Diagnosis not present

## 2015-01-28 DIAGNOSIS — L0100 Impetigo, unspecified: Secondary | ICD-10-CM

## 2015-01-28 MED ORDER — MUPIROCIN CALCIUM 2 % EX CREA: 1.0000 "application " | TOPICAL_CREAM | Freq: Two times a day (BID) | CUTANEOUS | Status: DC

## 2015-01-28 MED ORDER — PREDNISONE 10 MG (48) PO TBPK: ORAL_TABLET | Freq: Every day | ORAL | Status: DC

## 2015-01-28 NOTE — Discharge Instructions (Signed)
Please take the steroids for the rash Please use the ointment for any open sores

## 2015-01-28 NOTE — ED Notes (Signed)
Via [place] interpreter Pt c/o rash/ring worm on bilateral legs/hands and abd onset this am Denies fevers, chills Alert, no signs of acute distress.

## 2015-01-28 NOTE — ED Provider Notes (Signed)
CSN: [id number]     Arrival date & time 01/28/15  1650 History   First MD Initiated Contact with Patient 01/28/15 1741     Chief Complaint  Patient presents with  . Rash   (Consider location/radiation/quality/duration/timing/severity/associated sxs/prior Treatment) HPI   Rash. Started 1 day ago. On torso, hands, feet, ankles. Itchy. Constant. Getting worse. Has not tried anything for her symptoms. Denies fevers, joint aches or pains, intentional weight loss, fevers, night sweats, chest pain, shortness of breath. States that this happens every few months. Has been to the dermatologist who gave him a steroid cream but states that when he went to the dermatologist he did not have any of the lesions.  History reviewed. No pertinent past medical history. History reviewed. No pertinent past surgical history. No family history on file. History  Substance Use Topics  . Smoking status: Former Games developer  . Smokeless tobacco: Not on file  . Alcohol Use: Yes    Review of Systems Per HPI with all other pertinent systems negative.   Allergies  Review of patient's allergies indicates no known allergies.  Home Medications   Prior to Admission medications   Medication Sig Start Date End Date Taking? Authorizing Provider  ibuprofen (ADVIL,MOTRIN) 600 MG tablet Take 1 tablet (600 mg total) by mouth every 6 (six) hours as needed. 10/18/14   Tiffany Neva Seat, PA-C  meclizine (ANTIVERT) 25 MG tablet Take 1 tablet (25 mg total) by mouth 3 (three) times daily as needed for dizziness. 08/20/14   Rolan Bucco, MD  mupirocin cream (BACTROBAN) 2 % Apply 1 application topically 2 (two) times daily. 01/28/15   Ozella Rocks, MD  predniSONE (STERAPRED UNI-PAK 48 TAB) 10 MG (48) TBPK tablet Take by mouth daily. Take as instructed 01/28/15   Ozella Rocks, MD   BP 122/81 mmHg  Pulse 75  Temp(Src) 98.6 F (37 C) (Oral)  Resp 18  SpO2 98% Physical Exam Physical Exam  Constitutional: oriented to person, place,  and time. appears well-developed and well-nourished. No distress.  HENT:  Head: Normocephalic and atraumatic.  Eyes: EOMI. PERRL.  Neck: Normal range of motion.  Cardiovascular: RRR, no m/r/g, 2+ distal pulses,  Pulmonary/Chest: Effort normal and breath sounds normal. No respiratory distress.  Abdominal: Soft. Bowel sounds are normal. NonTTP, no distension.  Musculoskeletal: Normal range of motion. Non ttp, no effusion.  Neurological: alert and oriented to person, place, and time.  Skin: Very large circular macular erythematous lesions on torso, feet and ankles and hands. Few areas of excoriation and honey crusting.  Psychiatric: normal mood and affect. behavior is normal. Judgment and thought content normal.           ED Course  Procedures (including critical care time) Labs Review Labs Reviewed - No data to display  Imaging Review No results found.   MDM   1. Urticaria   2. Impetigo    Prednisone 12 day Dosepak, appears seen. Follow-up with dermatology. Of note patient has had lesional biopsies on the past which were without a definitive diagnoses    Ozella Rocks, MD 01/28/15 Rickey Primus

## 2015-10-12 IMAGING — DX DG CHEST 2V
2 series · 2 of 2 positions shown · non-contrast
Comparison: 12/19/2006

CLINICAL DATA: Fever, body aches with cough for 3 days

EXAM:
CHEST  2 VIEW

[chest pa]
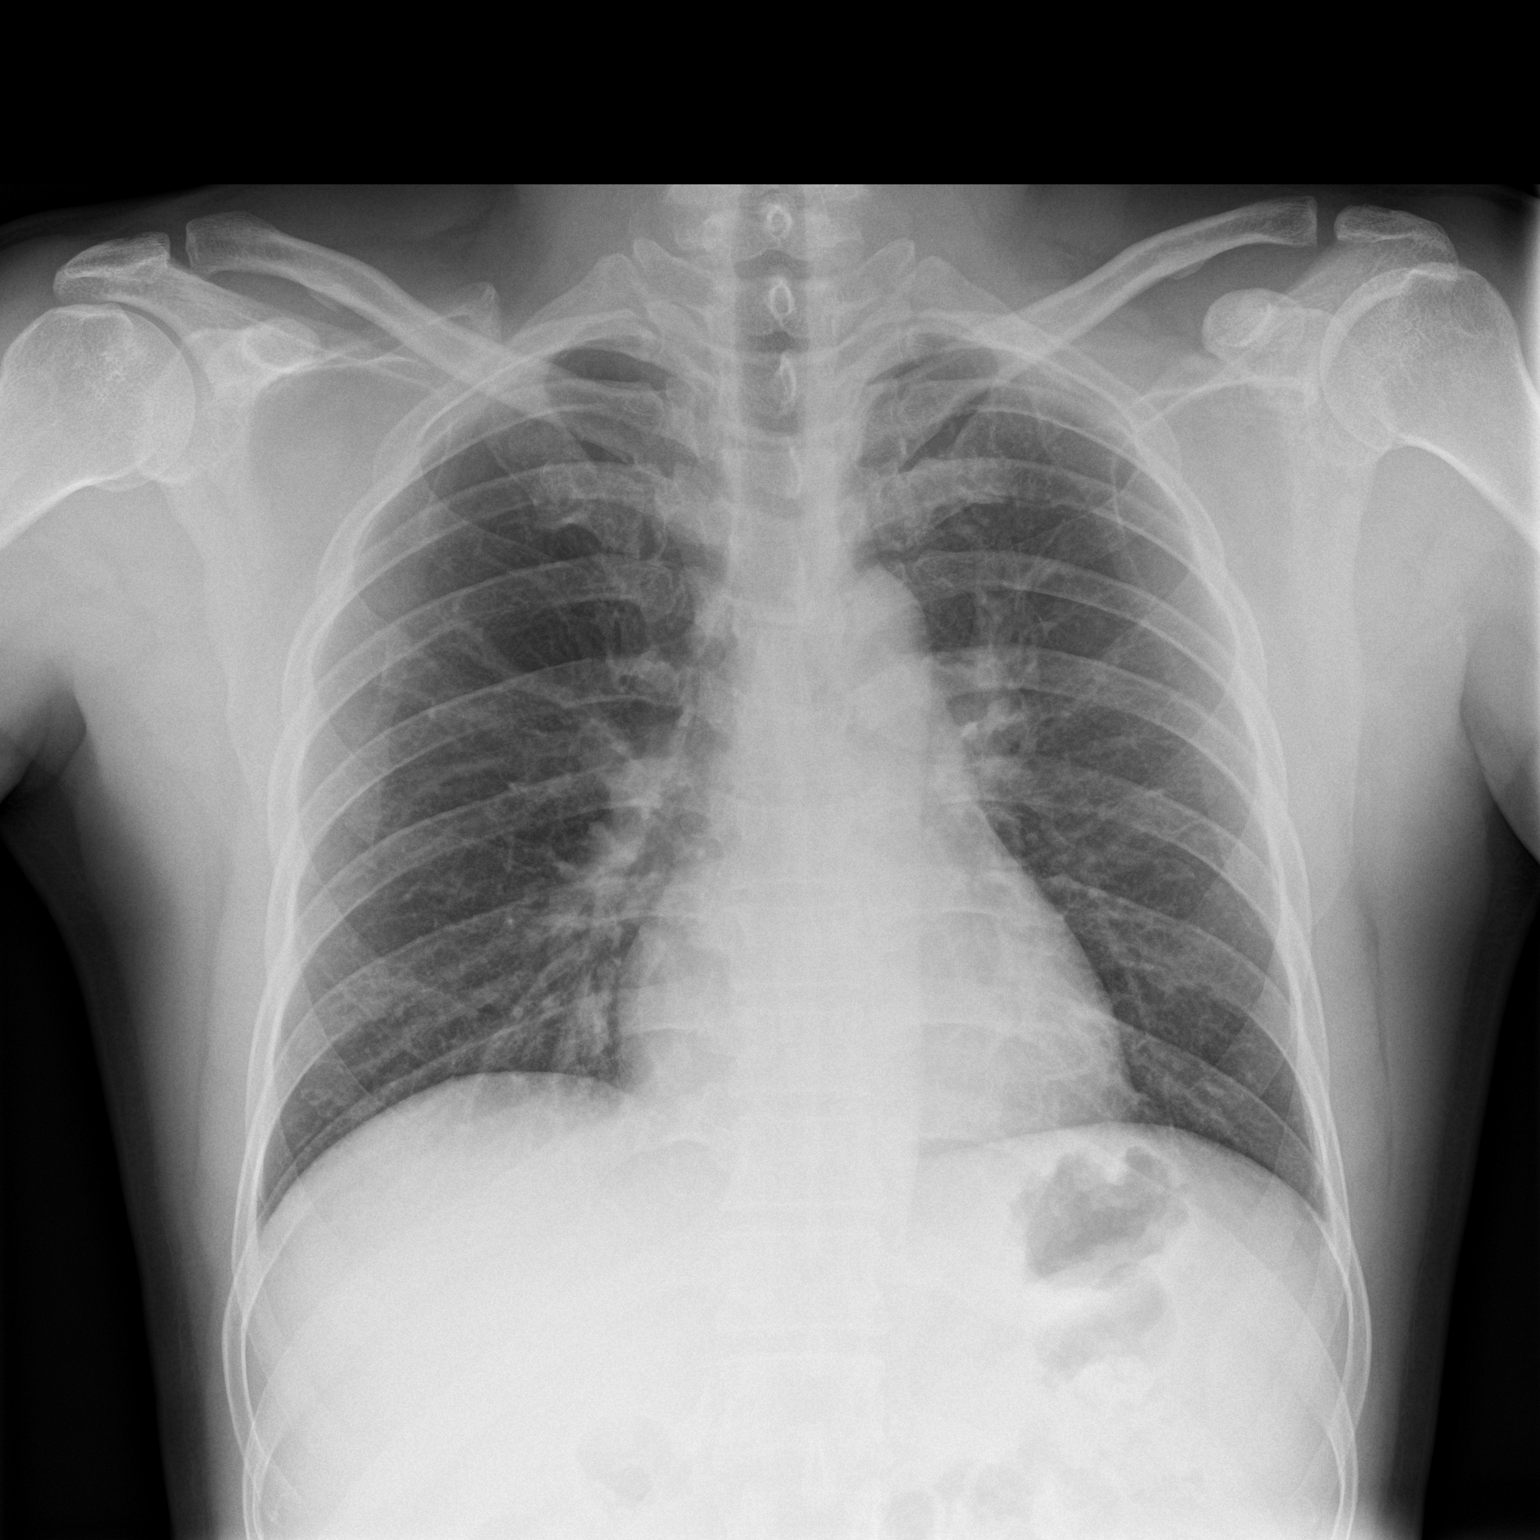

[chest lat]
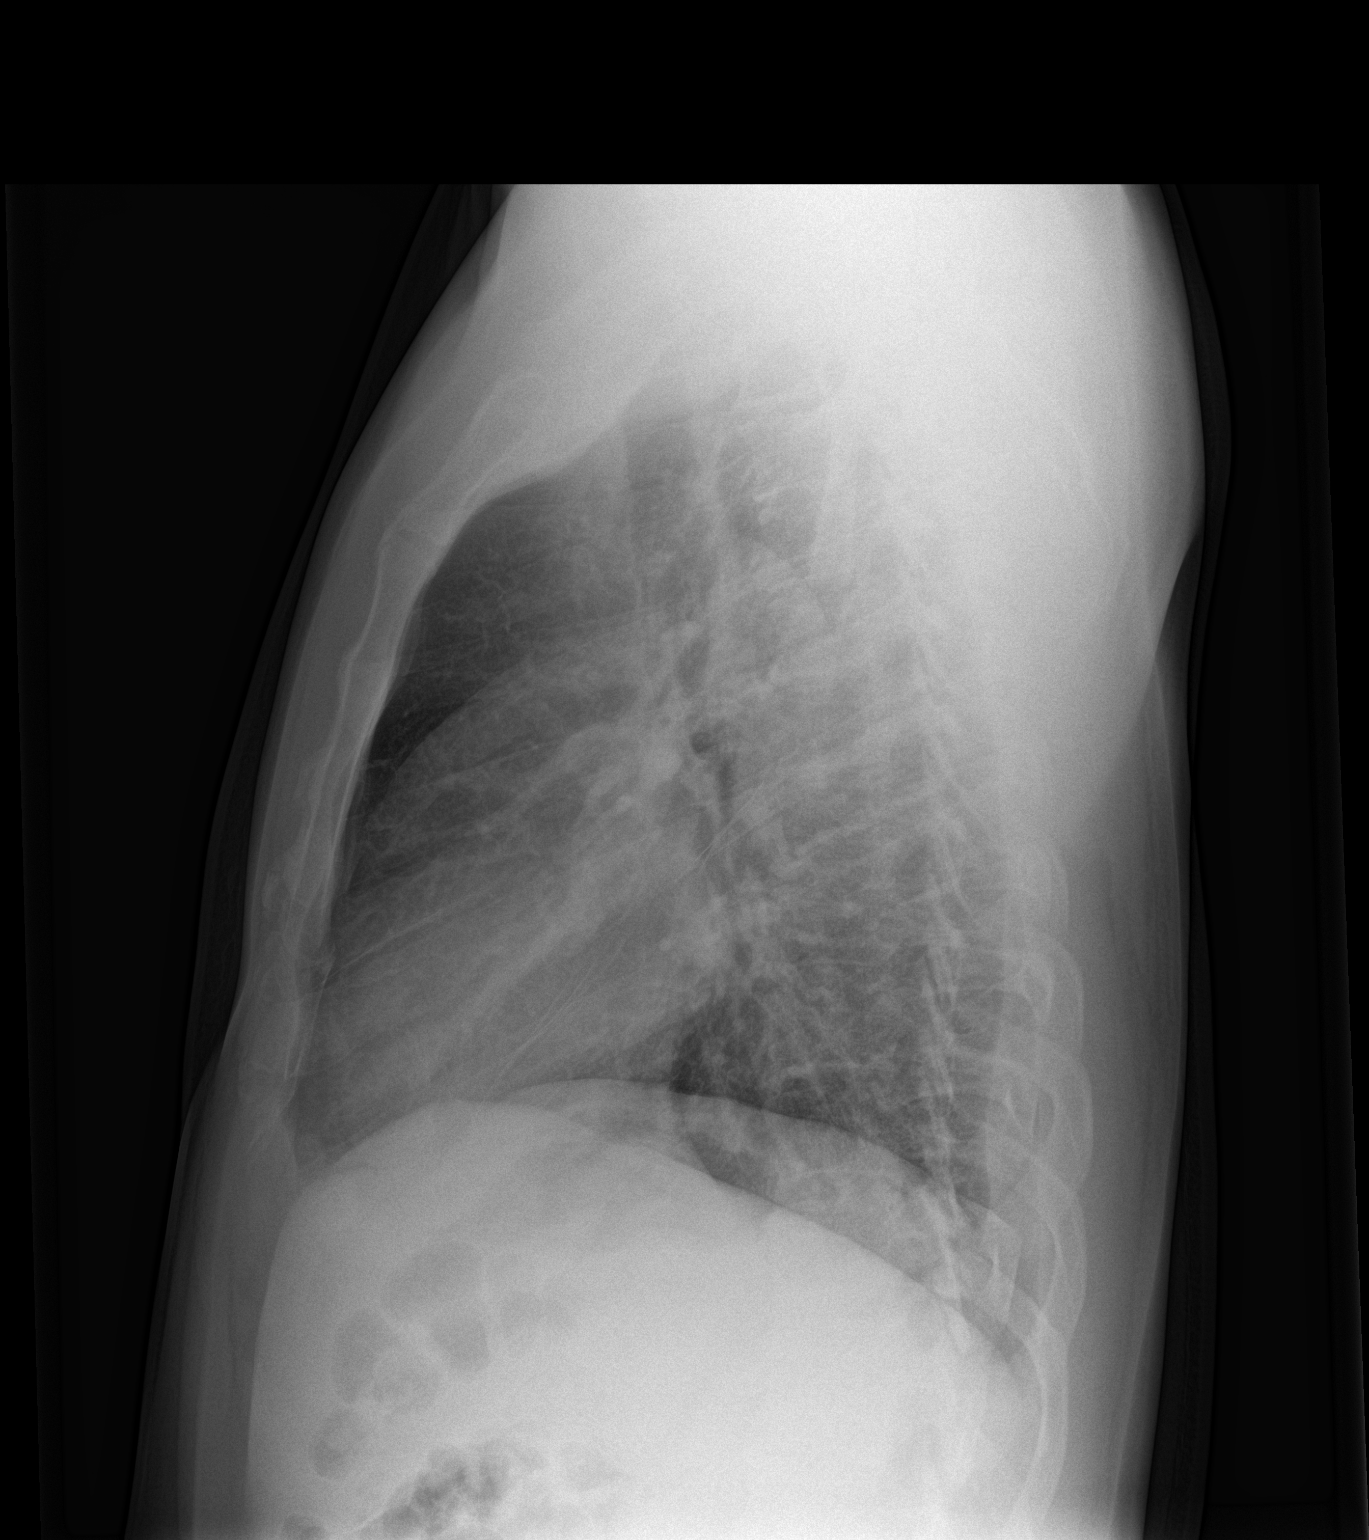

[2 of 2 positions shown; findings below may reference images not displayed]

FINDINGS: The heart size and mediastinal contours are within normal limits.
Both lungs are clear. The visualized skeletal structures are
unremarkable.
IMPRESSION: No active cardiopulmonary disease.

## 2015-12-29 ENCOUNTER — Ambulatory Visit (HOSPITAL_COMMUNITY)
Admission: EM | Admit: 2015-12-29 | Discharge: 2015-12-29 | Disposition: A | Discharge status: Home / Self Care | Payer: Managed Care, Other (non HMO) | Attending: Emergency Medicine | Admitting: Emergency Medicine

## 2015-12-29 ENCOUNTER — Encounter (HOSPITAL_COMMUNITY): Payer: Self-pay | Admitting: Emergency Medicine

## 2015-12-29 DIAGNOSIS — Z87891 Personal history of nicotine dependence: Secondary | ICD-10-CM | POA: Diagnosis not present

## 2015-12-29 DIAGNOSIS — L0231 Cutaneous abscess of buttock: Secondary | ICD-10-CM | POA: Diagnosis not present

## 2015-12-29 DIAGNOSIS — L0291 Cutaneous abscess, unspecified: Secondary | ICD-10-CM | POA: Diagnosis present

## 2015-12-29 MED ORDER — HYDROCODONE-ACETAMINOPHEN 5-325 MG PO TABS: 1.0000 | ORAL_TABLET | Freq: Four times a day (QID) | ORAL | Status: DC | PRN

## 2015-12-29 MED ORDER — CLINDAMYCIN HCL 300 MG PO CAPS: 300.0000 mg | ORAL_CAPSULE | Freq: Three times a day (TID) | ORAL | Status: DC

## 2015-12-29 NOTE — Discharge Instructions (Signed)
The abscess is already draining on its own. I did collect a culture. We will call you if we need to change antibiotics. Take clindamycin 3 times a day for 10 days. This may cause some diarrhea. Apply warm compresses for 20 minutes at least 3 times a day. You can take Tylenol or ibuprofen as needed for pain. Use the hydrocodone every 4-6 hours as needed for severe pain. Do not drive while taking this medicine. If this is not improving in 2 days, please come back.

## 2015-12-29 NOTE — ED Provider Notes (Signed)
CSN: [id number]     Arrival date & time 12/29/15  1339 History   First MD Initiated Contact with Patient 12/29/15 1414     Chief Complaint  Patient presents with  . Abscess   (Consider location/radiation/quality/duration/timing/severity/associated sxs/prior Treatment) HPI  He is a 40 year old man here with a family member who acts as interpreter when needed. He reports a right buttock abscess that started 3 days ago. It has been getting larger and more tender. No fevers or chills. No nausea or vomiting. He states he had an abscess in the same place many years ago. He states when the old abscess healed, it became a firm nodule.  History reviewed. No pertinent past medical history. History reviewed. No pertinent past surgical history. History reviewed. No pertinent family history. Social History  Substance Use Topics  . Smoking status: Former Games developermoker  . Smokeless tobacco: None  . Alcohol Use: Yes    Review of Systems As in history of present illness Allergies  Review of patient's allergies indicates no known allergies.  Home Medications   Prior to Admission medications   Medication Sig Start Date End Date Taking? Authorizing Provider  clindamycin (CLEOCIN) 300 MG capsule Take 1 capsule (300 mg total) by mouth 3 (three) times daily. 12/29/15   Charm RingsErin J Joceline Hinchcliff, MD  HYDROcodone-acetaminophen (NORCO) 5-325 MG tablet Take 1 tablet by mouth every 6 (six) hours as needed for moderate pain. 12/29/15   Charm RingsErin J Dhanush Jokerst, MD  ibuprofen (ADVIL,MOTRIN) 600 MG tablet Take 1 tablet (600 mg total) by mouth every 6 (six) hours as needed. 10/18/14   Marlon Peliffany Greene, PA-C   Meds Ordered and Administered this Visit  Medications - No data to display  BP 124/79 mmHg  Pulse 83  Temp(Src) 98.6 F (37 C) (Oral)  Resp 16  SpO2 99% No data found.   Physical Exam  Constitutional: He is oriented to person, place, and time. He appears well-developed and well-nourished.  Cardiovascular: Normal rate.     Pulmonary/Chest: Effort normal.  Genitourinary:     Neurological: He is alert and oriented to person, place, and time.    ED Course  Procedures (including critical care time)  Labs Review Labs Reviewed  AEROBIC CULTURE (SUPERFICIAL SPECIMEN)    Imaging Review No results found.    MDM   1. Abscess of buttock, right    Culture of expressed pus sent. We'll place on clindamycin and warm compresses. Tylenol or ibuprofen as needed for pain. Prescription given for hydrocodone to use as needed for severe pain. Follow-up if not improving in 2 days.   Charm RingsErin J Duru Reiger, MD 12/29/15 1444

## 2015-12-29 NOTE — ED Notes (Signed)
Reports abscess to back of right thigh/buttocks that he noticed 3 days ago. History of the same.

## 2016-01-01 LAB — AEROBIC CULTURE W GRAM STAIN (SUPERFICIAL SPECIMEN)

## 2016-01-01 LAB — AEROBIC CULTURE  (SUPERFICIAL SPECIMEN)

## 2017-01-14 ENCOUNTER — Encounter (HOSPITAL_COMMUNITY): Payer: Self-pay | Admitting: *Deleted

## 2017-01-14 ENCOUNTER — Ambulatory Visit (HOSPITAL_COMMUNITY)
Admission: EM | Admit: 2017-01-14 | Discharge: 2017-01-14 | Disposition: A | Discharge status: Home / Self Care | Payer: Managed Care, Other (non HMO) | Attending: Internal Medicine | Admitting: Internal Medicine

## 2017-01-14 DIAGNOSIS — M5442 Lumbago with sciatica, left side: Secondary | ICD-10-CM | POA: Diagnosis not present

## 2017-01-14 MED ORDER — DEXAMETHASONE SODIUM PHOSPHATE 10 MG/ML IJ SOLN
10.0000 mg | Freq: Once | INTRAMUSCULAR | Status: AC
  Administered 2017-01-14: 10 mg via INTRAMUSCULAR

## 2017-01-14 MED ORDER — DEXAMETHASONE SODIUM PHOSPHATE 10 MG/ML IJ SOLN
INTRAMUSCULAR | Status: AC
  Filled 2017-01-14: qty 1

## 2017-01-14 MED ORDER — CYCLOBENZAPRINE HCL 10 MG PO TABS: 10.0000 mg | ORAL_TABLET | Freq: Two times a day (BID) | ORAL | 0 refills | Status: DC | PRN

## 2017-01-14 MED ORDER — KETOROLAC TROMETHAMINE 30 MG/ML IJ SOLN
INTRAMUSCULAR | Status: AC
  Filled 2017-01-14: qty 1

## 2017-01-14 MED ORDER — DICLOFENAC SODIUM 75 MG PO TBEC: 75.0000 mg | DELAYED_RELEASE_TABLET | Freq: Two times a day (BID) | ORAL | 0 refills | Status: DC

## 2017-01-14 MED ORDER — KETOROLAC TROMETHAMINE 30 MG/ML IJ SOLN
30.0000 mg | Freq: Once | INTRAMUSCULAR | Status: AC
  Administered 2017-01-14: 30 mg via INTRAMUSCULAR

## 2017-01-14 NOTE — ED Triage Notes (Signed)
Pt reports     2   Weeks       Of  Back   Pain      Lower  l   Side          Pain  Is   Worse   On    Movement

## 2017-01-14 NOTE — Discharge Instructions (Signed)
You most likely have a strained muscle in your lower back. I have prescribed two medicines for your pain. The first is diclofenac, take 1 tablet twice a day and the other is Flexeril, take 1 tablet twice a day. Flexeril may cause drowsiness so do not drive until you know how this medicine affects you. Also do not drink any alcohol either. You may apply ice and alternate with heat for 15 minutes at a time 4 times daily and for additional pain control you may take tylenol over the counter ever 4 hours but do not take more than 4000 mg a day. Should your pain continue or fail to resolve, follow up with your primary care provider or return to clinic as needed.  °

## 2017-01-14 NOTE — ED Provider Notes (Signed)
CSN: [id number]     Arrival date & time 01/14/17  1615 History   None    Chief Complaint  Patient presents with  . Back Pain   (Consider location/radiation/quality/duration/timing/severity/associated sxs/prior Treatment) The history is provided by the patient.  Back Pain  Location:  Lumbar spine Quality:  Aching, burning and stiffness Stiffness is present:  All day Radiates to:  L thigh Pain severity:  Moderate Pain is:  Same all the time Onset quality:  Gradual Duration:  2 weeks Timing:  Constant Progression:  Unchanged Context: lifting heavy objects   Relieved by:  Being still Worsened by:  Bending, twisting and lying down Ineffective treatments:  None tried Associated symptoms: no bladder incontinence, no bowel incontinence, no dysuria, no fever, no tingling and no weight loss     History reviewed. No pertinent past medical history. History reviewed. No pertinent surgical history. No family history on file. Social History  Substance Use Topics  . Smoking status: Former Games developermoker  . Smokeless tobacco: Never Used  . Alcohol use Yes    Review of Systems  Constitutional: Negative for chills, fever and weight loss.  HENT: Negative.   Respiratory: Negative.   Cardiovascular: Negative.   Gastrointestinal: Negative.  Negative for bowel incontinence.  Genitourinary: Negative for bladder incontinence and dysuria.  Musculoskeletal: Positive for back pain. Negative for neck pain and neck stiffness.  Skin: Negative.   Neurological: Negative.  Negative for tingling.    Allergies  Patient has no known allergies.  Home Medications   Prior to Admission medications   Medication Sig Start Date End Date Taking? Authorizing Provider  clindamycin (CLEOCIN) 300 MG capsule Take 1 capsule (300 mg total) by mouth 3 (three) times daily. 12/29/15   Charm RingsHonig, Erin J, MD  cyclobenzaprine (FLEXERIL) 10 MG tablet Take 1 tablet (10 mg total) by mouth 2 (two) times daily as needed for muscle  spasms. 01/14/17   Dorena BodoKennard, Dontavion Noxon, NP  diclofenac (VOLTAREN) 75 MG EC tablet Take 1 tablet (75 mg total) by mouth 2 (two) times daily. 01/14/17   Dorena BodoKennard, Udell Mazzocco, NP  HYDROcodone-acetaminophen (NORCO) 5-325 MG tablet Take 1 tablet by mouth every 6 (six) hours as needed for moderate pain. 12/29/15   Charm RingsHonig, Erin J, MD  ibuprofen (ADVIL,MOTRIN) 600 MG tablet Take 1 tablet (600 mg total) by mouth every 6 (six) hours as needed. 10/18/14   Marlon PelGreene, Tiffany, PA-C   Meds Ordered and Administered this Visit   Medications  ketorolac (TORADOL) 30 MG/ML injection 30 mg (30 mg Intramuscular Given 01/14/17 1725)  dexamethasone (DECADRON) injection 10 mg (10 mg Intramuscular Given 01/14/17 1724)    BP 108/70 (BP Location: Right Arm)   Pulse 78   Temp 98.2 F (36.8 C) (Oral)   Resp 16   SpO2 98%  No data found.   Physical Exam  Constitutional: He is oriented to person, place, and time. He appears well-developed and well-nourished. No distress.  HENT:  Head: Normocephalic and atraumatic.  Right Ear: External ear normal.  Left Ear: External ear normal.  Cardiovascular: Normal rate and regular rhythm.   Pulmonary/Chest: Effort normal and breath sounds normal.  Musculoskeletal:       Lumbar back: He exhibits tenderness and spasm. He exhibits no bony tenderness.       Back:  Neurological: He is alert and oriented to person, place, and time.  Skin: Skin is warm and dry. Capillary refill takes less than 2 seconds. He is not diaphoretic.  Psychiatric: He has a normal mood  and affect. His behavior is normal.  Nursing note and vitals reviewed.   Urgent Care Course     Procedures (including critical care time)  Labs Review Labs Reviewed - No data to display  Imaging Review No results found.  MDM   1. Acute left-sided low back pain with left-sided sciatica     Andrew Harrison is a 41 y.o. male with a 2 week history of back pain.  Differential diagnosis for back pain includes muscle spasm/strain,  slipped disc with radicular pain including sciatica, slipped disc with cauda equina syndrome, vertebral fracture, vertebral tumor, epidural abscess/discitis, or pyelonephritis.  Based on history, and physical exam, most likely etiology of this patient's  back pain is acute muscle spasms/strain. It could be due to slipped disc. Emergent MRI is not indicated as this patient does not have new weakness, or cauda equina syndrome. Patient has no bowel or bladder incontinence.  I do not believe a fracture to be the source of patient's pain as there was no preceding trauma.  Based on history, and physical, imaging of the spine was not done.  I do not believe the pain is caused by epidural abscess as the patient denies history of IV drug use, and does not have fever/chills and no recent procedures.  Vertebral tumor is unlikely as the patient does not have weight loss or night sweats, and no history of cancer.  I do not believe this patient's pain represents a ruptured AAA. There is no palpable, pulsatile mass on the exam and patient does not have any abdominal pain.    Dorena Bodo, NP 01/14/17 1737

## 2017-02-28 ENCOUNTER — Encounter (HOSPITAL_COMMUNITY): Payer: Self-pay | Admitting: *Deleted

## 2017-02-28 ENCOUNTER — Ambulatory Visit (HOSPITAL_COMMUNITY)
Admission: EM | Admit: 2017-02-28 | Discharge: 2017-02-28 | Disposition: A | Discharge status: Home / Self Care | Payer: Managed Care, Other (non HMO) | Attending: Family Medicine | Admitting: Family Medicine

## 2017-02-28 DIAGNOSIS — L500 Allergic urticaria: Secondary | ICD-10-CM | POA: Diagnosis not present

## 2017-02-28 DIAGNOSIS — L0100 Impetigo, unspecified: Secondary | ICD-10-CM

## 2017-02-28 MED ORDER — PREDNISONE 10 MG (48) PO TBPK: ORAL_TABLET | ORAL | 0 refills | Status: DC

## 2017-02-28 MED ORDER — MUPIROCIN CALCIUM 2 % EX CREA
1.0000 | TOPICAL_CREAM | Freq: Two times a day (BID) | CUTANEOUS | 0 refills | Status: DC
Start: 2017-02-28 — End: 2018-02-07

## 2017-02-28 NOTE — ED Provider Notes (Signed)
  [facility]   [id number] 02/28/17 Arrival Time: 1026  ASSESSMENT & PLAN:  1. Allergic urticaria   2. Impetigo     Meds ordered this encounter  Medications  . predniSONE (STERAPRED UNI-PAK 48 TAB) 10 MG (48) TBPK tablet    Sig: Take as directed.    Dispense:  48 tablet    Refill:  0  . mupirocin cream (BACTROBAN) 2 %    Sig: Apply 1 application topically 2 (two) times daily.    Dispense:  15 g    Refill:  0   Will f/u if not improving within 48 hours. OTC Benadryl if needed.  Reviewed expectations re: course of current medical issues. Questions answered. Outlined signs and symptoms indicating need for more acute intervention. Patient verbalized understanding. After Visit Summary given.   SUBJECTIVE:  Andrew Harrison is a 41 y.o. male who presents with complaint of itchy rash starting yesterday. H/O the exact same symptoms in the past. Usually once yearly. Biopsies done in the past that were inconclusive. Responds to prednisone and completely resolves. No recent illnesses or new exposures. No recent travel. No respiratory symptoms. No self treatment.  ROS: As per HPI.   OBJECTIVE:  Vitals:   02/28/17 1159  BP: 120/78  Pulse: 88  Resp: 16  Temp: 98 F (36.7 C)  TempSrc: Oral  SpO2: 99%    General appearance: alert; no distress Eyes: PERRLA; EOMI; conjunctiva normal HENT: normocephalic; atraumatic; TMs normal; nasal mucosa normal; oral mucosa normal Lungs: clear to auscultation bilaterallyrness Extremities: no cyanosis or edema; symmetrical with no gross deformities Skin: multiple large wheels over extremities; excoriations with yellow crusting; tender to touch Psychological: alert and cooperative; normal mood and affect  No Known Allergies   Social History   Social History  . Marital status: Married    Spouse name: N/A  . Number of children: N/A  . Years of education: N/A   Occupational History  . Not on file.   Social History Main Topics  .  Smoking status: Former Games developermoker  . Smokeless tobacco: Never Used  . Alcohol use Yes  . Drug use: No  . Sexual activity: Not on file   Other Topics Concern  . Not on file   Social History Narrative  . No narrative on file   History reviewed. No pertinent surgical history.   Mardella LaymanHagler, Emony Dormer, MD 03/02/17 1050

## 2017-02-28 NOTE — ED Triage Notes (Signed)
Pt  Has   Swelling    And     Blisters  On   Feet   And  Arms     And    Some   Swelling  As   Well  Pt  Also  Has  A  Burning  lesuion   To  l   Side  Of  Chest      The  Blisters   Itch    And  Burns    And  The   Lesion  On  Chest   Burns    The  Pt  Reports  Has  Had  This  In  Past  The  Pt  Reports  Pain on   Weight  Bearing

## 2017-07-02 DIAGNOSIS — R739 Hyperglycemia, unspecified: Secondary | ICD-10-CM

## 2017-07-02 DIAGNOSIS — E785 Hyperlipidemia, unspecified: Secondary | ICD-10-CM

## 2017-07-02 HISTORY — DX: Hyperlipidemia, unspecified: E78.5

## 2017-07-02 HISTORY — DX: Hyperglycemia, unspecified: R73.9

## 2017-12-24 ENCOUNTER — Encounter: Payer: Self-pay | Admitting: Internal Medicine

## 2017-12-24 ENCOUNTER — Ambulatory Visit: Payer: 59 | Admitting: Internal Medicine

## 2017-12-24 VITALS — BP 118/80 | HR 74 | Resp 12 | Ht 65.0 in | Wt 167.0 lb

## 2017-12-24 DIAGNOSIS — R238 Other skin changes: Secondary | ICD-10-CM | POA: Diagnosis not present

## 2017-12-24 NOTE — Patient Instructions (Addendum)
Will let your son know when we have an appointment ready for you

## 2017-12-24 NOTE — Progress Notes (Signed)
Subjective:    Patient ID: Andrew Harrison, male    DOB: 09-02-1975, 42 y.o.   MRN: [id number]  HPI   Here to establish [place], [place],  has been patient here before and son interprets.  Here with skin blistering and pain.  This has been a problem about every 6 months for years. Started first at age 60 yo when living in refugee camp in [place].  He ended up in camp fleeing [place] at age 15 yo. Starts with itching, redness and gets hot.  When he scratches, the area turns dark or black.   Some areas also develop flaccid to more tense blisters, generally filled with clear viscous yellow fluid. He has had this biopsied before, he believes with dermatology in 2009 or 2010.  Unable to find in this chart. Usually takes a month for this to resolve if he does not seek care. Ultimately, his skin peels off where the involvement is--ulcerates--they show me photos of past involvement of left medial ankle.  These ulcers are painful. The areas involved are generally the same:  Dorsal hands and feet, interdigital, forearms and lower legs.  Sometimes lateral trunk in axillary line--this is where he had biopsy in past.   He does not take any medication regularly and has not taken anything recently.   May take meds for back pain here and there. States prednisone and antibiotics in past have not really reduced severity or length of involvement of skin. No family history of similar problem in others  No outpatient medications have been marked as taking for the 12/24/17 encounter (Office Visit) with Julieanne Manson, MD.   No Known Allergies   No past medical history on file.   Social History   Socioeconomic History  . Marital status: Married    Spouse name: Not on file  . Number of children: 6  . Years of education: Not on file  . Highest education level: Not on file  Occupational History  . Occupation: Counsellor company    Comment: Building surveyor for [employer]  Social Needs  .  Financial resource strain: Not on file  . Food insecurity:    Worry: Not on file    Inability: Not on file  . Transportation needs:    Medical: Not on file    Non-medical: Not on file  Tobacco Use  . Smoking status: Former Games developer  . Smokeless tobacco: Current User    Types: Chew  . Tobacco comment: Uses dip currently  Substance and Sexual Activity  . Alcohol use: Yes    Comment: sometimes drinks beer.  History of drinking too much many years ago  . Drug use: No  . Sexual activity: Not on file  Lifestyle  . Physical activity:    Days per week: Not on file    Minutes per session: Not on file  . Stress: Not on file  Relationships  . Social connections:    Talks on phone: Not on file    Gets together: Not on file    Attends religious service: Not on file    Active member of club or organization: Not on file    Attends meetings of clubs or organizations: Not on file    Relationship status: Not on file  . Intimate partner violence:    Fear of current or ex partner: Not on file    Emotionally abused: Not on file    Physically abused: Not on file    Forced sexual activity:  Not on file  Other Topics Concern  . Not on file  Social History Narrative  . Not on file   Review of Systems     Objective:   Physical Exam NAD HEENT:  PERRL, EOMI, oral mucosa without lesion though has blackening of teeth consistent with nut and tobacco staining. Neck:  Supple, No adenopathy Chest:  CTA CV:  RRR without murmur or rub, radial and DP pulses normal and equal Skin;  Patches of erythema on dorsal hands and interdigital dorsal areas of hands with central darkening and what appears almost separation of superficial layers of epidermis from lower layers with "ruffling" of superficial layer.  Small areas of blistering --interdigital involvement noted specifically On ankles and feet, much more pronounced involvement with erythematous patches, central darkening and several large tense vesicles.   Feet are swollen and lesions are tender to touch.  Difficulty walking to exam table and climbing up due to pain in ankles and feet. Fluid in vesicles appears clear yellow. No involvement of palms or plantar feet. Old scarring from lesion in axillary line, left trunk.  No acute lesions here.       Assessment & Plan:  Recurrent vesicular skin disease with prominent itching:  Feel most consistent with bullous pemphigoid. Not able to find any inciting factor with meds, etc. [facility] and confirmed he has been see there in past. Would like to have lesions rebiopsied and treatment to prevent recurrence, whatever this turns out to be.   Letter to be off work until beginning of next week.

## 2018-01-01 ENCOUNTER — Encounter: Payer: Self-pay | Admitting: Internal Medicine

## 2018-01-01 ENCOUNTER — Ambulatory Visit (INDEPENDENT_AMBULATORY_CARE_PROVIDER_SITE_OTHER): Payer: 59 | Admitting: Internal Medicine

## 2018-01-01 VITALS — BP 124/88 | HR 82 | Resp 12 | Ht 65.0 in | Wt 169.0 lb

## 2018-01-01 DIAGNOSIS — R238 Other skin changes: Secondary | ICD-10-CM

## 2018-01-01 NOTE — Progress Notes (Signed)
   Subjective:    Patient ID: Andrew Harrison, male    DOB: 10/26/1975, 42 y.o.   MRN: [id number]  HPI   Daughter in law stopped in yesterday and stated he was having too much pain in feet to go to work. He has been seen by Dermatology now twice and sounds like definitively diagnosed with bullous pemphigoid.   Having most pain in right foot where vesicle ruptured and is not covered with a dressing. He is having too much pain to work.  His left ankle lesion is covered with nonstick dressing.  He states this is not bothersome.  States he is using a white antibiotic ointment prescribed by Derm --sounds like silvadene.  Current Meds  Medication Sig  . augmented betamethasone dipropionate (DIPROLENE-AF) 0.05 % ointment APPLY AA ON SKIN BID  . predniSONE (DELTASONE) 20 MG tablet TK 2 T PO QD FOR 1 WEEK THEN 1 T QD FOR 1 WEEK   No Known Allergies   Review of Systems     Objective:   Physical Exam   Skin much improved since last visit when first presented  Right foot with wet ulcerative lesion with edges healing at base of lateral toes, dorsal foot and mid foot just distal to ankle with smaller and more healed lesion, also weeping     Assessment & Plan:  Bullous pemphigoid:  Right foot dressed with silvadene ointment, telfa pad and kerlex.  Discussed to change twice daily as he has with left foot. To call if not healed adequately for pain control to work in 2 days.

## 2018-01-01 NOTE — Patient Instructions (Signed)
Change dressing to feet twice daily as instructed by Derm--use same ointment to right foot and keep covered. Call if does not continue to heal

## 2018-01-02 DIAGNOSIS — R238 Other skin changes: Secondary | ICD-10-CM | POA: Insufficient documentation

## 2018-02-07 ENCOUNTER — Encounter: Payer: Self-pay | Admitting: Internal Medicine

## 2018-02-07 ENCOUNTER — Ambulatory Visit: Payer: 59 | Admitting: Internal Medicine

## 2018-02-07 VITALS — BP 118/78 | HR 68 | Resp 12 | Ht 65.0 in | Wt 168.0 lb

## 2018-02-07 DIAGNOSIS — R238 Other skin changes: Secondary | ICD-10-CM

## 2018-02-07 DIAGNOSIS — H547 Unspecified visual loss: Secondary | ICD-10-CM

## 2018-02-07 NOTE — Progress Notes (Signed)
   Subjective:    Patient ID: Andrew Harrison, male    DOB: 04/14/1976, 42 y.o.   MRN: [id number]  HPI   Interpreted by daughter in law  1.  Concerned with vision for past 2 years. Has to take a long time looking at smaller print and colors to be able to see clearly.  He is noting need to hold things farther away to focus.   Also, noting need for more light to see.   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   He has tried reading glasses, but no help over long period of time. Not clear if he was trying to use for distance vision as well.    2.  ?Bullous Pemphigoid:  States he was told and daughter in law confirms that ultimately, biopsy of skin was inconclusive and not clear this was bullous pemphigoid as I thought had been relayed to me [facility]   His legs are fine now, though with hyperpigmentation where some bullous lesions were earlier in year.  No outpatient medications have been marked as taking for the 02/07/18 encounter (Office Visit) with Julieanne MansonMulberry, Selene Peltzer, MD.    No Known Allergies Review of Systems     Objective:   Physical Exam NAD HEENT:  PERRL, EOMI, Discs sharp, TMs pearly gray, throat without injection Neck:  Supple, No adenopathy Chest:  CTA CV: RRR without murmur or rub.  Radial pulses normal and equal Skin:  No lesions       Assessment & Plan:  1.  Decreased visual acuity, likely presbyopia:  Eye referral May try out reading glasses from pharmacy until can get appt.  2.  Recurrent vesicular/bullous eruption of skin:  Biopsy/fluorsence apparently not supportive of bullous pemphigoid.  Not clear of cause.  To call and be seen again if recurs.  Seems to respond to prednisone.

## 2018-02-19 ENCOUNTER — Other Ambulatory Visit: Payer: 59

## 2018-02-21 ENCOUNTER — Other Ambulatory Visit: Payer: 59

## 2018-02-24 ENCOUNTER — Other Ambulatory Visit (INDEPENDENT_AMBULATORY_CARE_PROVIDER_SITE_OTHER): Payer: 59

## 2018-02-24 DIAGNOSIS — Z130 Encounter for screening for diseases of the blood and blood-forming organs and certain disorders involving the immune mechanism: Secondary | ICD-10-CM | POA: Diagnosis not present

## 2018-02-24 DIAGNOSIS — R739 Hyperglycemia, unspecified: Secondary | ICD-10-CM

## 2018-02-24 DIAGNOSIS — Z13220 Encounter for screening for lipoid disorders: Secondary | ICD-10-CM

## 2018-02-25 LAB — CBC WITH DIFFERENTIAL/PLATELET
BASOS ABS: 0.1 10*3/uL (ref 0.0–0.2)
Basos: 1 %
EOS (ABSOLUTE): 0.6 10*3/uL — AB (ref 0.0–0.4)
Eos: 8 %
HEMOGLOBIN: 16 g/dL (ref 13.0–17.7)
Hematocrit: 47.2 % (ref 37.5–51.0)
Immature Grans (Abs): 0 10*3/uL (ref 0.0–0.1)
Immature Granulocytes: 0 %
LYMPHS ABS: 2.7 10*3/uL (ref 0.7–3.1)
Lymphs: 37 %
MCH: 30 pg (ref 26.6–33.0)
MCHC: 33.9 g/dL (ref 31.5–35.7)
MCV: 89 fL (ref 79–97)
MONOCYTES: 5 %
MONOS ABS: 0.4 10*3/uL (ref 0.1–0.9)
NEUTROS ABS: 3.5 10*3/uL (ref 1.4–7.0)
Neutrophils: 49 %
Platelets: 201 10*3/uL (ref 150–450)
RBC: 5.33 x10E6/uL (ref 4.14–5.80)
RDW: 13.2 % (ref 12.3–15.4)
WBC: 7.2 10*3/uL (ref 3.4–10.8)

## 2018-02-25 LAB — COMPREHENSIVE METABOLIC PANEL
ALK PHOS: 60 IU/L (ref 39–117)
ALT: 42 IU/L (ref 0–44)
AST: 24 IU/L (ref 0–40)
Albumin/Globulin Ratio: 2 (ref 1.2–2.2)
Albumin: 4.9 g/dL (ref 3.5–5.5)
BUN/Creatinine Ratio: 14 (ref 9–20)
BUN: 12 mg/dL (ref 6–24)
Bilirubin Total: 1.4 mg/dL — ABNORMAL HIGH (ref 0.0–1.2)
CO2: 23 mmol/L (ref 20–29)
CREATININE: 0.88 mg/dL (ref 0.76–1.27)
Calcium: 10 mg/dL (ref 8.7–10.2)
Chloride: 101 mmol/L (ref 96–106)
GFR calc Af Amer: 122 mL/min/{1.73_m2} (ref >59)
GFR, EST NON AFRICAN AMERICAN: 106 mL/min/{1.73_m2} (ref >59)
GLUCOSE: 101 mg/dL — AB (ref 65–99)
Globulin, Total: 2.4 g/dL (ref 1.5–4.5)
Potassium: 4.1 mmol/L (ref 3.5–5.2)
Sodium: 140 mmol/L (ref 134–144)
Total Protein: 7.3 g/dL (ref 6.0–8.5)

## 2018-02-25 LAB — LIPID PANEL W/O CHOL/HDL RATIO
CHOLESTEROL TOTAL: 229 mg/dL — AB (ref 100–199)
HDL: 40 mg/dL (ref >39)
LDL CALC: 139 mg/dL — AB (ref 0–99)
TRIGLYCERIDES: 251 mg/dL — AB (ref 0–149)
VLDL CHOLESTEROL CAL: 50 mg/dL — AB (ref 5–40)

## 2018-06-11 ENCOUNTER — Encounter: Payer: Self-pay | Admitting: Internal Medicine

## 2018-06-11 ENCOUNTER — Ambulatory Visit: Payer: 59 | Admitting: Internal Medicine

## 2018-06-11 VITALS — BP 124/80 | HR 64 | Resp 12 | Ht 65.0 in | Wt 168.0 lb

## 2018-06-11 DIAGNOSIS — Z0000 Encounter for general adult medical examination without abnormal findings: Secondary | ICD-10-CM

## 2018-06-11 DIAGNOSIS — E7800 Pure hypercholesterolemia, unspecified: Secondary | ICD-10-CM

## 2018-06-11 DIAGNOSIS — L308 Other specified dermatitis: Secondary | ICD-10-CM | POA: Diagnosis not present

## 2018-06-11 DIAGNOSIS — R739 Hyperglycemia, unspecified: Secondary | ICD-10-CM | POA: Diagnosis not present

## 2018-06-11 DIAGNOSIS — Z23 Encounter for immunization: Secondary | ICD-10-CM | POA: Diagnosis not present

## 2018-06-11 NOTE — Progress Notes (Signed)
Subjective:   Patient ID: Andrew Harrison, male    DOB: [birth year], 42 y.o.   MRN: [id number]  HPI   Andrew Harrison, his adult son, interprets today  Here for Male CPE:  1.  STE:  Does not perform.  No family history of testicular cancer.  2.  PSA/DRE: Has had DRE many years ago.  No PSA.  No family history of prostate cancer.  3.  Guaiac Cards:  Never.    4.  Colonoscopy: Never.  No family history of colon cancer.  5.  Cholesterol/Glucose:  History of mildly elevated glucose and cholesterol.  Lipid Panel     Component Value Date/Time   CHOL 229 (H) 02/24/2018 1017   TRIG 251 (H) 02/24/2018 1017   HDL 40 02/24/2018 1017   LDLCALC 139 (H) 02/24/2018 1017    6.  Immunizations: Not sure if he has ever been immunized against Td.   Came to [place] in 2007 and received immunizations.   Has not had flu vaccine this year.    No outpatient medications have been marked as taking for the 06/11/18 encounter (Office Visit) with Julieanne Manson, MD.    No Known Allergies   No past medical history on file.   No past surgical history on file.   Family History  Problem Relation Age of Onset  . Lumbar disc disease Son    Social History   Socioeconomic History  . Marital status: Married    Spouse name: Andrew Harrison  . Number of children: 6  . Years of education: 0  . Highest education level: Not on file  Occupational History  . Occupation: Counsellor company    Comment: Building surveyor for [employer]  Social Needs  . Financial resource strain: Not on file  . Food insecurity:    Worry: Never true    Inability: Never true  . Transportation needs:    Medical: No    Non-medical: No  Tobacco Use  . Smoking status: Former Games developer  . Smokeless tobacco: Current User    Types: Chew  . Tobacco comment: Uses dip currently  Substance and Sexual Activity  . Alcohol use: Yes    Comment: sometimes drinks beer.  History of drinking too much many years ago  . Drug use: No  . Sexual activity:  Yes    Birth control/protection: Injection  Lifestyle  . Physical activity:    Days per week: Not on file    Minutes per session: Not on file  . Stress: Not on file  Relationships  . Social connections:    Talks on phone: More than three times a week    Gets together: More than three times a week    Attends religious service: More than 4 times per year    Active member of club or organization: No    Attends meetings of clubs or organizations: Never    Relationship status: Married  . Intimate partner violence:    Fear of current or ex partner: No    Emotionally abused: No    Physically abused: No    Forced sexual activity: No  Other Topics Concern  . Not on file  Social History Narrative   Lives at home with Harrison and 5 of children.   Andrew Harrison and children live in separate townhome.    One of his married sons lived with them with his Harrison and family.   Andrew Harrison does not read, never had education when  growing up   Working on deal for him to learn Burmese or English in place of chewing tobacco when he is bored.      Review of Systems  Constitutional: Positive for fatigue. Negative for appetite change and fever.  HENT: Negative for dental problem, ear pain, hearing loss, rhinorrhea and sore throat.   Eyes: Positive for visual disturbance (Has tried reading glasses which helps.  Had appt scheduled for eye specialist, but did not go.  ).  Respiratory: Negative for cough and shortness of breath.   Cardiovascular: Negative for chest pain, palpitations and leg swelling.  Gastrointestinal: Negative for abdominal pain, blood in stool, constipation and diarrhea.  Genitourinary: Negative for dysuria, frequency and testicular pain.  Musculoskeletal: Negative for arthralgias.  Skin:       Recurrent vesicular eruptions that have not tested positive for bullous pemphigoid or otherwise.  Neurological: Negative for weakness and numbness.  Psychiatric/Behavioral: Negative for  dysphoric mood. The patient is not nervous/anxious.        His place of work will Harrison closing, but is not particularly concerned about the job loss.      Objective:  Physical Exam Constitutional:      Appearance: He is normal weight.  HENT:     Head: Normocephalic and atraumatic.     Right Ear: Hearing, tympanic membrane, ear canal and external ear normal.     Left Ear: Hearing, tympanic membrane, ear canal and external ear normal.     Nose: Nose normal.     Mouth/Throat:     Mouth: Mucous membranes are moist.     Dentition: Abnormal dentition (Teeth stained brown and ground down on bite surfaces diffusely.).  Eyes:     Extraocular Movements: Extraocular movements intact.     Conjunctiva/sclera: Conjunctivae normal.     Pupils: Pupils are equal, round, and reactive to light.     Funduscopic exam:    Right eye: No papilledema. Red reflex present.        Left eye: No papilledema. Red reflex present. Neck:     Musculoskeletal: Full passive range of motion without pain, normal range of motion and neck supple.  Cardiovascular:     Rate and Rhythm: Normal rate and regular rhythm.     Heart sounds: S1 normal and S2 normal. No murmur. No friction rub. No S3 or S4 sounds.      Comments: No carotid bruits.  Carotid, radial, femoral, DP and PT pulses normal and equal.  Pulmonary:     Effort: Pulmonary effort is normal.     Breath sounds: Normal breath sounds.  Abdominal:     General: Bowel sounds are normal.     Palpations: Abdomen is soft. There is no hepatomegaly, splenomegaly or mass.     Tenderness: There is no abdominal tenderness.     Hernia: No hernia is present.  Genitourinary:    Comments: Refused GU/Rectal exam Musculoskeletal: Normal range of motion.  Lymphadenopathy:     Head:     Right side of head: No submental or submandibular adenopathy.     Left side of head: No submental or submandibular adenopathy.     Cervical: No cervical adenopathy.     Upper Body:     Right  upper body: No supraclavicular or axillary adenopathy.     Left upper body: No supraclavicular or axillary adenopathy.     Lower Body: No right inguinal adenopathy. No left inguinal adenopathy.  Skin:    General: Skin is warm.  Capillary Refill: Capillary refill takes less than 2 seconds.     Findings: No lesion (Scarring in areas of feet where had bullous lesions earlier in year.).  Neurological:     Mental Status: He is alert and oriented to person, place, and time.     Cranial Nerves: Cranial nerves are intact.     Sensory: Sensation is intact.     Motor: Motor function is intact.     Coordination: Coordination is intact.     Gait: Gait is intact.     Deep Tendon Reflexes: Reflexes are normal and symmetric.  Psychiatric:        Attention and Perception: Attention normal.        Mood and Affect: Mood normal.        Speech: Speech normal.        Behavior: Behavior normal.        Thought Content: Thought content normal.        Cognition and Memory: Cognition normal.        Judgment: Judgment normal.     No GU/rectal exam Teeth stained and ground down Otherwise normal     Assessment & Plan:  1.  CPE Flu vaccine To return Guaiac Cards x 3 in 2 weeks. To obtain Td at [facility].  Had Tdap 2008.  2.  Hyperglycemia and Hypercholesterolemia:  BMP and FLP today.  Purple top to check A1C if glucose hight  3.  Recurrent vesicular eruptions:  Have not been able to document a cause.  To come in early for prednisone if occurs as responds to this.  Working diagnosis is reaction to bug bites

## 2018-06-13 LAB — BASIC METABOLIC PANEL
BUN / CREAT RATIO: 19 (ref 9–20)
BUN: 16 mg/dL (ref 6–24)
CO2: 24 mmol/L (ref 20–29)
CREATININE: 0.85 mg/dL (ref 0.76–1.27)
Calcium: 9.4 mg/dL (ref 8.7–10.2)
Chloride: 100 mmol/L (ref 96–106)
GFR, EST AFRICAN AMERICAN: 124 mL/min/{1.73_m2} (ref >59)
GFR, EST NON AFRICAN AMERICAN: 107 mL/min/{1.73_m2} (ref >59)
Glucose: 92 mg/dL (ref 65–99)
Potassium: 4.2 mmol/L (ref 3.5–5.2)
Sodium: 139 mmol/L (ref 134–144)

## 2018-06-13 LAB — LIPID PANEL W/O CHOL/HDL RATIO
Cholesterol, Total: 229 mg/dL — ABNORMAL HIGH (ref 100–199)
HDL: 38 mg/dL — ABNORMAL LOW (ref >39)
LDL CALC: 142 mg/dL — AB (ref 0–99)
TRIGLYCERIDES: 244 mg/dL — AB (ref 0–149)
VLDL Cholesterol Cal: 49 mg/dL — ABNORMAL HIGH (ref 5–40)

## 2018-06-27 ENCOUNTER — Other Ambulatory Visit (INDEPENDENT_AMBULATORY_CARE_PROVIDER_SITE_OTHER): Payer: 59

## 2018-06-27 DIAGNOSIS — Z1211 Encounter for screening for malignant neoplasm of colon: Secondary | ICD-10-CM

## 2018-06-27 LAB — POC HEMOCCULT BLD/STL (HOME/3-CARD/SCREEN)
Card #3 Fecal Occult Blood, POC: NEGATIVE
FECAL OCCULT BLD: NEGATIVE
Fecal Occult Blood, POC: NEGATIVE

## 2018-10-13 ENCOUNTER — Telehealth: Payer: Self-pay

## 2018-10-13 NOTE — Telephone Encounter (Signed)
Ah Sar called regarding patient. States he had been to [place] and just came back about a week ago after being there for 1 month. Patient has been complaining of headaches since he went to [place]. Per Ah Sar not sure if he has been taking medication for headaches. States she will speak to patient and let us know.  Ah Sar has been instructed to have patient self quarantine for the next 14 days being that he has traveled internationally. Instructions gone over with Ah Sar about social distancing and clean and his dishes separate and wearing gloves and mask. Ah Sar verbalized understanding.

## 2019-01-27 ENCOUNTER — Ambulatory Visit: Payer: Self-pay | Admitting: Internal Medicine

## 2019-01-27 ENCOUNTER — Encounter: Payer: Self-pay | Admitting: Internal Medicine

## 2019-01-27 ENCOUNTER — Other Ambulatory Visit: Payer: Self-pay

## 2019-01-27 VITALS — BP 112/80 | HR 72 | Resp 12 | Ht 65.0 in | Wt 167.0 lb

## 2019-01-27 DIAGNOSIS — E782 Mixed hyperlipidemia: Secondary | ICD-10-CM

## 2019-01-27 DIAGNOSIS — G44209 Tension-type headache, unspecified, not intractable: Secondary | ICD-10-CM

## 2019-01-27 DIAGNOSIS — R739 Hyperglycemia, unspecified: Secondary | ICD-10-CM

## 2019-01-27 NOTE — Progress Notes (Signed)
     Subjective:    Patient ID: Andrew Harrison, male   DOB: 1975-11-20, 43 y.o.   MRN: [id number]   HPI   Andrew Harrison interprets  1.  Hyperlipidemia:  He has worked on his diet.  Not physically active.  He stopped working at [employer] due to headaches it sounds like, but has not started anything active on his own. Fasting today  2.  Headaches:  Posterior neck and nuchal area.  Started in March, but none in past 2 weeks.  Stopped working in [employer] about 4 weeks ago.   Previously, would have a headache two days out of a week.  He would take Tylenol, unknown strength, 2 tabs, he would take a nap and when he awakened, the headache would be gone.   He does feel he was stressed regarding the COVID 19 pandemic and keeping his family safe during the time period of his headache, thought that still continues.  Feels that stress remains the same, even after stopping work.   He finally relates that he thought he might be infected with COVID19, so he did not want to stay at work and possibly infect others.   Never had sore throat, fever, myalgias, loss of smell, or cough.   He does state the work he was doing at [employer] required him to be bent over his work--he found himself needing to stretch his neck, upper back and shoulders out during the work.  3.  Bullous lesions of legs:  No recurrence this summer.     No outpatient medications have been marked as taking for the 01/27/19 encounter (Office Visit) with Mack Hook, MD.   No Known Allergies   Review of Systems    Objective:   BP 112/80 (BP Location: Left Arm, Patient Position: Sitting, Cuff Size: Normal)   Pulse 72   Resp 12   Ht 5\' 5"  (1.651 m)   Wt 167 lb (75.8 kg)   BMI 27.79 kg/m   Physical Exam  NAD HEENT:  PERRL, EOMI, Discs sharp bilaterally.  TMs pearly gray. Neck:  Supple, No adenopathy Chest:  CTA CV:  RRR without murmur or rub.  Radial and DP pulses normal and equal Neuro:  A & O x 3, CN  II-XII  grossly intact, DTRs 2+/4 throughout, motor 5/5 throughout, sensory grossly normal.  Gait normal. MS:  NT over traps, upper thoracic and cervical paraspinous musculature and to nuchal ridge, though states palpation actually feels good. Somewhat poor posture with slumped shoulders while sitting.    Assessment & Plan   1.  Muscle Tension Headache:  Not clear of cause, but certainly describes work as likely etiology.   Discussed good core posture to prevent. Ibuprofen or tylenol for pain in future and call if resistant to treatement.  2.  Bullous lesions of legs:  No recent recurrence.  Corticosteroids if does. Dermatology Work up has not shown a clear etiology  3.  Hyperlipidemia and hyperglycemia:  Fasting today and reportedly has made some dietary changes.  Encouraged regular physical activity as well.

## 2019-01-28 LAB — BASIC METABOLIC PANEL
BUN/Creatinine Ratio: 11 (ref 9–20)
BUN: 9 mg/dL (ref 6–24)
CO2: 23 mmol/L (ref 20–29)
Calcium: 9.7 mg/dL (ref 8.7–10.2)
Chloride: 98 mmol/L (ref 96–106)
Creatinine, Ser: 0.85 mg/dL (ref 0.76–1.27)
GFR calc Af Amer: 123 mL/min/{1.73_m2} (ref >59)
GFR calc non Af Amer: 107 mL/min/{1.73_m2} (ref >59)
Glucose: 92 mg/dL (ref 65–99)
Potassium: 4 mmol/L (ref 3.5–5.2)
Sodium: 139 mmol/L (ref 134–144)

## 2019-01-28 LAB — LIPID PANEL W/O CHOL/HDL RATIO
Cholesterol, Total: 221 mg/dL — ABNORMAL HIGH (ref 100–199)
HDL: 36 mg/dL — ABNORMAL LOW (ref >39)
LDL Calculated: 123 mg/dL — ABNORMAL HIGH (ref 0–99)
Triglycerides: 309 mg/dL — ABNORMAL HIGH (ref 0–149)
VLDL Cholesterol Cal: 62 mg/dL — ABNORMAL HIGH (ref 5–40)

## 2019-07-28 ENCOUNTER — Encounter: Payer: Self-pay | Admitting: Internal Medicine

## 2020-06-30 ENCOUNTER — Telehealth: Payer: Self-pay | Admitting: Internal Medicine

## 2020-06-30 NOTE — Telephone Encounter (Signed)
Patient is having a headache and his eyes hurt since Friday. No redness or any other symptoms. Patient stated that he has taking Ibuprofen and Advil. Patient is requesting an appointment to see Doctor and stated that his employer is requesting for him to see a Doctor to make sure he's save to go to work. Please advise.

## 2020-06-30 NOTE — Telephone Encounter (Signed)
Patient was given options for evaluation by staff, including going to get tested for COVID and where to do so as well as evaluation and treatment options--different urgent care facilities, etc.   We do not have anymore openings today.

## 2020-08-16 ENCOUNTER — Telehealth: Payer: Self-pay | Admitting: Clinical

## 2020-08-16 NOTE — Telephone Encounter (Signed)
Needs to apply warm compresses to the area for 20 minutes 3 times daily and see if will come to a head and drain. Otherwise, see if we can work in as an acute in next 1-2 days. See if any fever or generally not feeling well.

## 2020-08-16 NOTE — Telephone Encounter (Signed)
Called patient's son and let him shared with him Doctor's recommendations. He stated that the patient has had some mild headache. Patient will call back in 1-2 if symptoms don't improved to schedule for an acute appointment.

## 2020-09-19 ENCOUNTER — Emergency Department (HOSPITAL_COMMUNITY)
Admission: EM | Admit: 2020-09-19 | Discharge: 2020-09-19 | Disposition: A | Discharge status: Home / Self Care | Payer: BC Managed Care – PPO | Attending: Emergency Medicine | Admitting: Emergency Medicine

## 2020-09-19 ENCOUNTER — Encounter (HOSPITAL_COMMUNITY): Payer: Self-pay | Admitting: Emergency Medicine

## 2020-09-19 DIAGNOSIS — K612 Anorectal abscess: Secondary | ICD-10-CM | POA: Insufficient documentation

## 2020-09-19 DIAGNOSIS — M533 Sacrococcygeal disorders, not elsewhere classified: Secondary | ICD-10-CM | POA: Diagnosis present

## 2020-09-19 DIAGNOSIS — Z87891 Personal history of nicotine dependence: Secondary | ICD-10-CM | POA: Diagnosis not present

## 2020-09-19 DIAGNOSIS — L0291 Cutaneous abscess, unspecified: Secondary | ICD-10-CM

## 2020-09-19 MED ORDER — DOXYCYCLINE HYCLATE 100 MG PO CAPS: 100.0000 mg | ORAL_CAPSULE | Freq: Two times a day (BID) | ORAL | 0 refills | Status: DC

## 2020-09-19 MED ORDER — LIDOCAINE-EPINEPHRINE 1 %-1:100000 IJ SOLN
10.0000 mL | Freq: Once | INTRAMUSCULAR | Status: AC
  Administered 2020-09-19: 10 mL
  Filled 2020-09-19: qty 1

## 2020-09-19 NOTE — ED Triage Notes (Signed)
Pt reports an abcess on buttocks. He states this happened over 15 years ago and had to be drained.  This one began 1 week ago, pain with sitting due to location of abscess.

## 2020-09-19 NOTE — Discharge Instructions (Signed)
Please take antibiotics as prescribed Use warm baths several times a day and keep area clean and dry Please call [place] [facility] for follow-up and recheck

## 2020-09-19 NOTE — ED Provider Notes (Signed)
[facility] EMERGENCY DEPARTMENT Provider Note   CSN: [id number] Arrival date & time: 09/19/20  5003     History Chief Complaint  Patient presents with  . Recurrent Skin Infections    Andrew Harrison is a 45 y.o. male.  HPI Level 5 caveat secondary to need for interpreter Andrew Harrison is a 45 year old man who weeks Burmese.  He presents today complaining of pain and swelling in the right buttock area.  He states he has had problems in this area for many years.  He has had to have it drained in the past.  Over the past week he has had increased swelling and pain.  He states that he was seen by his primary care doctor and told to use warm compresses several weeks ago.  He denies having fever, chills, nausea, vomiting.  He has not noted any discharge from this area.    Past Medical History:  Diagnosis Date  . Hyperglycemia 2019  . Hyperlipidemia 2019  . 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 Vesicular dermatitis   . Vesicular eruption of skin 01/02/2018  . Hyperlipidemia 07/02/2017  . Hyperglycemia 07/02/2017    Past Surgical History:  Procedure Laterality Date  . HERNIA REPAIR  2008   Describes left inguinal herna repair (perhaps)       Family History  Problem Relation Age of Onset  . Lumbar disc disease Son     Social History   Tobacco Use  . Smoking status: Former Games developer  . Smokeless tobacco: Current User    Types: Chew  . Tobacco comment: Uses dip currently  Vaping Use  . Vaping Use: Never used  Substance Use Topics  . Alcohol use: Yes    Comment: sometimes drinks beer.  History of drinking too much many years ago  . Drug use: No    Home Medications Prior to Admission medications   Medication Sig Start Date End Date Taking? Authorizing Provider  augmented betamethasone dipropionate (DIPROLENE-AF) 0.05 % ointment APPLY AA ON SKIN BID  12/25/17   [provider]    Allergies    Patient has no known allergies.  Review of Systems   Review of Systems  All other systems reviewed and are negative.   Physical Exam Updated Vital Signs BP (!) 135/91 (BP Location: Left Arm)   Pulse 86   Temp 98.2 F (36.8 C)   Resp 14   SpO2 96%   Physical Exam Vitals and nursing note reviewed.  HENT:     Head: Normocephalic.     Right Ear: External ear normal.     Left Ear: External ear normal.     Mouth/Throat:     Pharynx: Oropharynx is clear.  Eyes:     Extraocular Movements: Extraocular movements intact.  Cardiovascular:     Rate and Rhythm: Normal rate and regular rhythm.  Pulmonary:     Effort: Pulmonary effort is normal.  Abdominal:     Palpations: Abdomen is soft.  Genitourinary:    Rectum: Normal.       Comments: Swelling and discharge to the right perirectal area Induration noted No obvious discharge Old incision site noted Musculoskeletal:     Cervical back: Normal range of motion.  Neurological:     Mental Status: He is alert.     ED Results / Procedures / Treatments   Labs (all labs ordered are  listed, but only abnormal results are displayed) Labs Reviewed - No data to display  EKG None  Radiology No results found.  Procedures .Marland KitchenIncision and Drainage  Date/Time: 09/19/2020 10:11 AM Performed by: Margarita Grizzle, MD Authorized by: Margarita Grizzle, MD   Consent:    Consent obtained:  Verbal   Consent given by:  Patient   Risks, benefits, and alternatives were discussed: yes     Risks discussed:  Bleeding, incomplete drainage and pain   Alternatives discussed:  No treatment Universal protocol:    Immediately prior to procedure, a time out was called: yes     Patient identity confirmed:  Verbally with patient and arm band Location:    Type:  Abscess   Location:  Anogenital   Anogenital location:  Perirectal Pre-procedure details:    Skin preparation:  Povidone-iodine Sedation:     Sedation type:  None Anesthesia:    Anesthesia method:  Local infiltration   Local anesthetic:  Lidocaine 1% WITH epi Procedure type:    Complexity:  Complex Procedure details:    Ultrasound guidance: no     Needle aspiration: no     Incision types:  Elliptical   Incision depth:  Submucosal   Wound management:  Probed and deloculated   Drainage:  Purulent   Drainage amount:  Moderate   Wound treatment:  Wound left open   Packing materials:  None Post-procedure details:    Procedure completion:  Tolerated well, no immediate complications     Medications Ordered in ED Medications  lidocaine-EPINEPHrine (XYLOCAINE W/EPI) 1 %-1:100000 (with pres) injection 10 mL (has no administration in time range)    ED Course  I have reviewed the triage vital signs and the nursing notes.  Pertinent labs & imaging results that were available during my care of the patient were reviewed by me and considered in my medical decision making (see chart for details).    MDM Rules/Calculators/A&P                          45 year old man presents today with perirectal abscess. Rectal exam performed and no tenderness in the rectum. I&D performed Plan oral antibiotics Plan referral to surgery for outpatient follow-up Final Clinical Impression(s) / ED Diagnoses Final diagnoses:  Abscess    Rx / DC Orders ED Discharge Orders    None       Margarita Grizzle, MD 09/19/20 1015

## 2020-10-10 ENCOUNTER — Telehealth: Payer: Self-pay | Admitting: Internal Medicine

## 2020-10-10 NOTE — Telephone Encounter (Signed)
Son of patient called requesting an appointment for patient. Tierce stated that patient has been in pain for about two week in the incision Doctors made to removed an abscess. Patient also noticed the area getting red and green and sometimes clear fluid comes out of it. Patient took the medicine Doctors gave him at the hospital, Doxycycline, but does not have any more. Please advise.

## 2020-10-11 NOTE — Telephone Encounter (Signed)
Please work him in as an acute.   He should go to the ED in the meantime if worsens, fever, increased pain

## 2020-10-11 NOTE — Telephone Encounter (Signed)
Patient was scheduled for 10/12/2020

## 2020-10-12 ENCOUNTER — Other Ambulatory Visit: Payer: Self-pay

## 2020-10-12 ENCOUNTER — Ambulatory Visit: Payer: BC Managed Care – PPO | Admitting: Internal Medicine

## 2020-10-12 ENCOUNTER — Encounter: Payer: Self-pay | Admitting: Internal Medicine

## 2020-10-12 VITALS — BP 130/81 | HR 63 | Wt 166.0 lb

## 2020-10-12 DIAGNOSIS — K611 Rectal abscess: Secondary | ICD-10-CM

## 2020-10-12 MED ORDER — CIPROFLOXACIN HCL 500 MG PO TABS: 500.0000 mg | ORAL_TABLET | Freq: Two times a day (BID) | ORAL | 0 refills | Status: DC

## 2020-10-12 MED ORDER — METRONIDAZOLE 500 MG PO TABS: ORAL_TABLET | ORAL | 0 refills | Status: DC

## 2020-10-12 NOTE — Patient Instructions (Signed)
Warm water bathtub soak twice daily  Okay if gauze falls out. Still needs to see surgery

## 2020-10-12 NOTE — Progress Notes (Incomplete)
    Subjective:    Patient ID: Andrew Harrison, male   DOB: 05-10-1976, 45 y.o.   MRN: [id number]   HPI   Son, [name], here to interpret Burmese.  Patient seen in ED for right perirectal abscess 09/19/20 for which he underwent I & D.  He finished 10 day course of Doxycycline 100 mg twice daily about 2 weeks.   He was to follow up with general surgery, but his appt was cancelled and rescheduled for 10/20/20 per son and patient.   He states he has not felt well since developing the abscess.  Does not feel the antibiotics really helped. Continues to have watery pustular and bloody drainage, itching, swelling and pain from the area.  He apparently squeezed a larger amount of discharge out of the area 2 days ago with some improvement. No fever.     Has been going to work at [employer] despite not feeling well.    No outpatient medications have been marked as taking for the 10/12/20 encounter (Office Visit) with Julieanne Manson, MD.   No Known Allergies   Review of Systems    Objective:   BP 130/81 (BP Location: Left Arm, Patient Position: Sitting, Cuff Size: Normal)   Pulse 63   Wt 166 lb (75.3 kg)   BMI 27.62 kg/m   Physical Exam   Assessment & Plan   ***

## 2021-06-16 ENCOUNTER — Telehealth: Payer: Self-pay

## 2021-06-16 NOTE — Telephone Encounter (Signed)
Patient has been experiencing blurred vision for about a month. Also has reoccurring dizziness. This is first time that he is experiencing this issue. Has no other symptoms. Is not currently taking any medication for this issue. Would like an appointment

## 2021-07-05 ENCOUNTER — Ambulatory Visit: Payer: BC Managed Care – PPO | Admitting: Internal Medicine

## 2021-07-27 ENCOUNTER — Ambulatory Visit: Payer: BC Managed Care – PPO | Admitting: Internal Medicine

## 2021-07-27 ENCOUNTER — Other Ambulatory Visit: Payer: Self-pay

## 2021-07-27 ENCOUNTER — Encounter: Payer: Self-pay | Admitting: Internal Medicine

## 2021-07-27 VITALS — BP 128/82 | HR 80 | Resp 12 | Ht 65.0 in | Wt 167.5 lb

## 2021-07-27 DIAGNOSIS — Z23 Encounter for immunization: Secondary | ICD-10-CM

## 2021-07-27 DIAGNOSIS — M545 Low back pain, unspecified: Secondary | ICD-10-CM

## 2021-07-27 DIAGNOSIS — H538 Other visual disturbances: Secondary | ICD-10-CM

## 2021-07-27 DIAGNOSIS — G44209 Tension-type headache, unspecified, not intractable: Secondary | ICD-10-CM | POA: Insufficient documentation

## 2021-07-27 MED ORDER — CYCLOBENZAPRINE HCL 10 MG PO TABS: ORAL_TABLET | ORAL | 1 refills | Status: AC

## 2021-07-27 MED ORDER — IBUPROFEN 200 MG PO TABS: ORAL_TABLET | ORAL | 0 refills | Status: AC

## 2021-07-27 NOTE — Progress Notes (Unsigned)
° ° °  Subjective:    Patient ID: Andrew Harrison, male   DOB: Jun 05, 1976, 46 y.o.   MRN: [id number]   HPI  Adult son, 67,  interprets   Headaches:  For 3-4 weeks.  Similar to tension headaches he has had in past--squeezing in nature from neck to occiput and around his head.  Generally in the evening.   Still works at [employer].  Did take Advil at one point.  One 200 mg tab with some relief.  His headache is much less a problem in recent days. Cannot say he has been more stressed recently. Cannot say he did anything at home or work where overuse may have led to headache.  2.  Blurred vision:  Has had for 6-7 months, after getting a particle of wood in his left eye when working at home.  Only involves his left eye.  Describes a continued foreign body sensation in eye.  Has difficulty opening eye in morning due to pain.  No Tetanus vaccine in 10 years.  3.  Bilateral low back pain:  Has had this before.  Started this time also about 3-4 weeks ago--started after the headache flared.   Pain does not radiate.   No numbness, tingling or weakness into legs. No urine or fecal incontinence. 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  Does this work for 8 hours for 5 days out of week.  Back pain not a problem when working, but when rests is when the pain starts up.    No outpatient medications have been marked as taking for the 07/27/21 encounter (Office Visit) with Julieanne Manson, MD.   No Known Allergies   Review of Systems    Objective:   BP 128/82 (BP Location: Left Arm, Patient Position: Sitting, Cuff Size: Normal)    Pulse 80    Resp 12    Ht 5\' 5"  (1.651 m)    Wt 167 lb 8 oz (76 kg)    BMI 27.87 kg/m   Physical Exam   Assessment & Plan   ***

## 2022-01-26 ENCOUNTER — Ambulatory Visit: Payer: BC Managed Care – PPO | Admitting: Internal Medicine

## 2022-08-07 ENCOUNTER — Ambulatory Visit: Payer: BC Managed Care – PPO | Admitting: Internal Medicine

## 2022-08-07 ENCOUNTER — Encounter: Payer: Self-pay | Admitting: Internal Medicine

## 2022-08-07 VITALS — BP 120/76 | HR 68 | Resp 16 | Ht 65.0 in | Wt 168.0 lb

## 2022-08-07 DIAGNOSIS — M7712 Lateral epicondylitis, left elbow: Secondary | ICD-10-CM

## 2022-08-07 DIAGNOSIS — G8929 Other chronic pain: Secondary | ICD-10-CM

## 2022-08-07 NOTE — Progress Notes (Incomplete)
    Subjective:    Patient ID: Andrew Harrison, male   DOB: Feb 19, 1976, 47 y.o.   MRN: [id number]   HPI   Joint pain:  Bilateral knees and elbows.  Sometimes they look red, but never swollen.  Works at [employer] and states when the pain started was when he was moving larger pieces of furniture.  The pain comes and goes.  Bothers him the most at end of day after coming home from work and sitting down for a while.  Takes ibuprofen 400 mg twice daily, which really helps.    2.  Scalp itchy and sometimes has dandruff flake out. Has never had before.  Has not changed his shampoo.     Current Meds  Medication Sig  . ibuprofen (ADVIL) 200 MG tablet Take 200 mg by mouth every 6 (six) hours as needed. 2 tabs by mouth twice daily   No Known Allergies   Review of Systems    Objective:   BP 120/76 (BP Location: Right Arm, Patient Position: Sitting, Cuff Size: Normal)   Pulse 68   Resp 16   Ht 5\' 5"  (1.651 m)   Wt 168 lb (76.2 kg)   BMI 27.96 kg/m   Physical Exam   Assessment & Plan   ***

## 2022-08-07 NOTE — Patient Instructions (Addendum)
[facility] First floor [address], [address] [place], [place] [address] Open 8 a.m to 5 p.m.   Eucerin Cream for eczema relief to skin after shower daily  Selsun Blue shampoo twice weekly for dandruff  [facility] [address], [place], [place] [address] [phone]  [facility] [address]. [place] [address] [phone]

## 2024-07-01 ENCOUNTER — Encounter (HOSPITAL_COMMUNITY): Payer: Self-pay

## 2024-07-01 ENCOUNTER — Other Ambulatory Visit: Payer: Self-pay

## 2024-07-01 ENCOUNTER — Ambulatory Visit (HOSPITAL_COMMUNITY)
Admission: RE | Admit: 2024-07-01 | Discharge: 2024-07-01 | Disposition: A | Discharge status: Home / Self Care | Source: Ambulatory Visit

## 2024-07-01 VITALS — BP 137/91 | HR 65 | Temp 98.2°F | Resp 18

## 2024-07-01 DIAGNOSIS — L0501 Pilonidal cyst with abscess: Secondary | ICD-10-CM | POA: Diagnosis not present

## 2024-07-01 MED ORDER — SULFAMETHOXAZOLE-TRIMETHOPRIM 800-160 MG PO TABS: 1.0000 | ORAL_TABLET | Freq: Two times a day (BID) | ORAL | 0 refills | Status: AC

## 2024-07-01 NOTE — ED Triage Notes (Signed)
 Pt has a bump on left buttock x 3 weeks. Not draining. Painful to sit. Friend is translating for triage per pt request

## 2024-07-01 NOTE — ED Provider Notes (Addendum)
 " [facility]    CSN: [id number] Arrival date & time: 07/01/24  0820      History   Chief Complaint Chief Complaint  Patient presents with   Abscess    HPI Andrew Harrison is a 48 y.o. male.   Mr. Andrew Harrison presents with complaint of recurrent abscess of the left buttocks that reoccurred 3 weeks ago.  He reports that it has been unchanged since onset.  He reports that it is soft to touch, painful, especially with sitting.  He denies any discharge from the area.  He reports that he has had to have the cyst lanced in the past, but it keeps recurring.  He has not done anything at home for his symptoms.  He is requesting that the cyst be drained today.  The history is provided by the patient.  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 Authorizing Provider  sulfamethoxazole-trimethoprim (BACTRIM DS) 800-160 MG tablet Take 1 tablet by mouth 2 (two) times daily for 7 days. 07/01/24 07/08/24 Yes Leatrice Vernell HERO, NP  augmented betamethasone dipropionate (DIPROLENE-AF) 0.05 % ointment APPLY AA ON SKIN BID Patient not taking: Reported on 10/12/2020 12/25/17   [provider]  cyclobenzaprine  (FLEXERIL ) 10 MG tablet 1/2 to 1 tab by mouth at bedtime for  back and neck pain Patient not taking: Reported on 08/07/2022 07/27/21   Adella Norris, MD  ibuprofen  (ADVIL ) 200 MG tablet 12-3 tabs by mouth twice daily with meals for 14 days. Patient not taking: Reported on 08/07/2022 07/27/21   Adella Norris, MD  ibuprofen  (ADVIL ) 200 MG tablet Take 200 mg by mouth every 6 (six) hours as needed. 2 tabs by mouth twice daily    [provider]    Family History Family History  Problem Relation Age of Onset   Lumbar disc disease Son     Social History Social History[1]   Allergies   Patient has no known allergies.   Review of Systems Review of Systems  Constitutional: Negative.   Gastrointestinal:  Negative for diarrhea, nausea and vomiting.  Skin:  Positive for wound.     Physical Exam Triage Vital Signs ED Triage Vitals  Encounter Vitals Group     BP 07/01/24 0903 (!) 137/91     Girls Systolic BP Percentile --      Girls Diastolic BP Percentile --      Boys Systolic BP Percentile --      Boys Diastolic BP Percentile --      Pulse Rate 07/01/24 0903 65     Resp 07/01/24 0903 18     Temp 07/01/24 0903 98.2 F (36.8 C)     Temp Source 07/01/24 0903 Oral  SpO2 07/01/24 0903 97 %     Weight --      Height --      Head Circumference --      Peak Flow --      Pain Score 07/01/24 0901 6     Pain Loc --      Pain Education --      Exclude from Growth Chart --    No data found.  Updated Vital Signs BP (!) 137/91 (BP Location: Right Arm)   Pulse 65   Temp 98.2 F (36.8 C) (Oral)   Resp 18   SpO2 97%   Visual Acuity Right Eye Distance:   Left Eye Distance:   Bilateral Distance:    Right Eye Near:   Left Eye Near:    Bilateral Near:     Physical Exam Vitals and nursing note reviewed. Exam conducted with a chaperone present Shirleyann, cma).  Constitutional:      General: He is not in acute distress.    Appearance: Normal appearance. He is normal weight. He is not toxic-appearing.  Eyes:      Conjunctiva/sclera: Conjunctivae normal.  Cardiovascular:     Rate and Rhythm: Normal rate and regular rhythm.     Heart sounds: Normal heart sounds.  Pulmonary:     Effort: Pulmonary effort is normal.     Breath sounds: Normal breath sounds and air entry.  Musculoskeletal:     Lumbar back: Decreased range of motion: Guarded due to pain. Negative right straight leg raise test and negative left straight leg raise test.  Skin:    General: Skin is warm and dry.     Findings: Abscess present. No rash.     Comments: 1.5 cm fluctuant at the abscess noted to the left intragluteal cleft.  Area is tender to palpation.  No erythema, induration, or warmth noted.  Neurological:     Mental Status: He is alert and oriented to person, place, and time.  Psychiatric:        Mood and Affect: Mood normal.        Behavior: Behavior normal.      UC Treatments / Results  Labs (all labs ordered are listed, but only abnormal results are displayed) Labs Reviewed - No data to display  EKG   Radiology No results found.  Procedures Incision and Drainage  Date/Time: 07/01/2024 9:58 AM  Performed by: Leatrice Vernell HERO, NP Authorized by: Leatrice Vernell HERO, NP   Consent:    Consent obtained:  Verbal   Consent given by:  Patient   Risks discussed:  Bleeding, incomplete drainage, pain and damage to other organs   Alternatives discussed:  No treatment Universal protocol:    Procedure explained and questions answered to patient or proxy's satisfaction: yes     Relevant documents present and verified: yes     Test results available : yes     Imaging studies available: yes     Required blood products, implants, devices, and special equipment available: yes     Site/side marked: yes     Immediately prior to procedure, a time out was called: yes     Patient identity confirmed:  Verbally with patient and arm band Location:    Type:  Pilonidal cyst   Size:  1.5cm   Location: left  buttock. Pre-procedure details:    Skin preparation:  Betadine Sedation:    Sedation type:  None Anesthesia:    Anesthesia method:  Local infiltration   Local  anesthetic:  Lidocaine  1% WITH epi (lot [id number] exp:08/2025) Procedure type:    Complexity:  Simple Procedure details:    Ultrasound guidance: no     Needle aspiration: no     Incision types:  Single straight   Incision depth:  Dermal   Wound management:  Probed and deloculated   Drainage:  Purulent   Drainage amount:  Moderate   Wound treatment:  Wound left open   Packing materials:  None Post-procedure details:    Procedure completion:  Tolerated well, no immediate complications Comments:     Dressed with bacitracin, gauze and tape   (including critical care time)  Medications Ordered in UC Medications - No data to display  Initial Impression / Assessment and Plan / UC Course  I have reviewed the triage vital signs and the nursing notes.  Pertinent labs & imaging results that were available during my care of the patient were reviewed by me and considered in my medical decision making (see chart for details).     Pilonidal cyst with abscess Incision and drainage performed in office today.  Purulent fluid expressed from area until only sanguinous drainage was left.  No complication from procedure.  Area dressed with bacitracin, gauze, and tape.  Prescription for Bactrim DS twice daily for 7 days sent to pharmacy.  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 When bacteria enter the cyst, it can become infected and turn into an abscess. Today, the abscess was  opened and drained to remove the infection and relieve pain. Common symptoms Pain or tenderness near the tailbone, swelling, redness, warmth, drainage of pus or blood, and difficulty sitting. Expected recovery Pain and pressure usually improve within a few days after drainage. Continued drainage from the site is normal for several days. Complete healing can take a few weeks. Follow-up with general surgery is important because some pilonidal cysts can recur and may need definitive treatment. Treatment plan Incision and drainage (I&D) This removed trapped pus and bacteria, which is the most important step in treating the infection. Keep the area clean and dry as instructed. Trimethoprim / sulfamethoxazole (Bactrim DS) This antibiotic treats skin bacteria, including those commonly found in abscesses. Take exactly as prescribed for 7 days, even if you feel better. Take with a full glass of water. Common side effects include nausea, vomiting, diarrhea, and rash. Call the office if you develop a widespread rash, severe itching, fever, or signs of an allergic reaction. Warm compresses Apply warm compresses to the area 2-3 times daily for 10-15 minutes. This helps improve blood flow, promotes continued drainage, and supports healing. Symptom management and home care Keep the area clean with gentle soap and water. Avoid prolonged sitting when possible. Wear loose-fitting clothing. Over-the-counter acetaminophen  or ibuprofen  may be used for pain if safe for you. Avoid squeezing or manipulating the area. Follow-up Keep your general surgery follow-up appointment as scheduled to discuss healing and long-term management. Call your clinic if Increasing pain, redness, or swelling; worsening drainage or foul odor; fever or chills; nausea or vomiting that prevents taking antibiotics; or the area is not improving after several days. Go  to the Emergency Department if High fever, spreading redness, severe pain,  rapid swelling, dizziness, or signs of a severe allergic reaction such as trouble breathing, facial or throat swelling, or hives.     ED Prescriptions     Medication Sig Dispense Auth. Provider   sulfamethoxazole-trimethoprim (BACTRIM DS) 800-160 MG tablet Take 1 tablet by mouth 2 (two) times daily for 7 days. 14 tablet Leatrice Vernell HERO, NP      PDMP not reviewed this encounter.    Leatrice Vernell HERO, NP 07/01/24 1001     [1]  Social History Tobacco Use   Smoking status: Former   Smokeless tobacco: Current    Types: Chew   Tobacco comments:    Uses dip currently  Vaping Use   Vaping status: Never Used  Substance Use Topics   Alcohol use: Not Currently    Comment: sometimes drinks beer.  History of drinking too much many years ago   Drug use: No     Leatrice Vernell HERO, NP 07/01/24 1248  "

## 2024-07-01 NOTE — Discharge Instructions (Signed)
 Pilonidal Cyst With Abscess - Discharge Instructions What this is A pilonidal cyst is a pocket of skin and hair that forms near the top of the buttocks crease. When bacteria enter the cyst, it can become infected and turn into an abscess. Today, the abscess was opened and drained to remove the infection and relieve pain. Common symptoms Pain or tenderness near the tailbone, swelling, redness, warmth, drainage of pus or blood, and difficulty sitting. Expected recovery Pain and pressure usually improve within a few days after drainage. Continued drainage from the site is normal for several days. Complete healing can take a few weeks. Follow-up with general surgery is important because some pilonidal cysts can recur and may need definitive treatment. Treatment plan Incision and drainage (I&D) This removed trapped pus and bacteria, which is the most important step in treating the infection. Keep the area clean and dry as instructed. Trimethoprim / sulfamethoxazole (Bactrim DS) This antibiotic treats skin bacteria, including those commonly found in abscesses. Take exactly as prescribed for 7 days, even if you feel better. Take with a full glass of water. Common side effects include nausea, vomiting, diarrhea, and rash. Call the office if you develop a widespread rash, severe itching, fever, or signs of an allergic reaction. Warm compresses Apply warm compresses to the area 2-3 times daily for 10-15 minutes. This helps improve blood flow, promotes continued drainage, and supports healing. Symptom management and home care Keep the area clean with gentle soap and water. Avoid prolonged sitting when possible. Wear loose-fitting clothing. Over-the-counter acetaminophen  or ibuprofen  may be used for pain if safe for you. Avoid squeezing or manipulating the area. Follow-up Keep your general surgery follow-up appointment as scheduled to discuss healing and long-term management. Call your clinic  if Increasing pain, redness, or swelling; worsening drainage or foul odor; fever or chills; nausea or vomiting that prevents taking antibiotics; or the area is not improving after several days. Go to the Emergency Department if High fever, spreading redness, severe pain, rapid swelling, dizziness, or signs of a severe allergic reaction such as trouble breathing, facial or throat swelling, or hives.

## 2024-10-14 ENCOUNTER — Ambulatory Visit: Admitting: Internal Medicine

## 2024-10-15 ENCOUNTER — Ambulatory Visit: Admitting: Internal Medicine
